# Patient Record
Sex: Male | Born: 1990 | Race: Black or African American | Hispanic: No | Marital: Single | Smoking: Never smoker
Health system: Southern US, Community
[De-identification: ages and names within clinical notes are randomized; demographics above are authoritative.]

## PROBLEM LIST (undated history)

## (undated) DIAGNOSIS — R519 Headache, unspecified: Secondary | ICD-10-CM

## (undated) DIAGNOSIS — M1990 Unspecified osteoarthritis, unspecified site: Secondary | ICD-10-CM

## (undated) DIAGNOSIS — F329 Major depressive disorder, single episode, unspecified: Secondary | ICD-10-CM

## (undated) DIAGNOSIS — F32A Depression, unspecified: Secondary | ICD-10-CM

## (undated) DIAGNOSIS — Z8619 Personal history of other infectious and parasitic diseases: Secondary | ICD-10-CM

## (undated) DIAGNOSIS — I10 Essential (primary) hypertension: Secondary | ICD-10-CM

## (undated) DIAGNOSIS — K5090 Crohn's disease, unspecified, without complications: Secondary | ICD-10-CM

## (undated) DIAGNOSIS — A630 Anogenital (venereal) warts: Secondary | ICD-10-CM

## (undated) DIAGNOSIS — R51 Headache: Secondary | ICD-10-CM

## (undated) HISTORY — DX: Headache: R51

## (undated) HISTORY — PX: TONSILLECTOMY: SUR1361

## (undated) HISTORY — DX: Essential (primary) hypertension: I10

## (undated) HISTORY — DX: Unspecified osteoarthritis, unspecified site: M19.90

## (undated) HISTORY — DX: Headache, unspecified: R51.9

## (undated) HISTORY — DX: Major depressive disorder, single episode, unspecified: F32.9

## (undated) HISTORY — PX: NASAL TURBINATE REDUCTION: SHX2072

## (undated) HISTORY — DX: Personal history of other infectious and parasitic diseases: Z86.19

## (undated) HISTORY — DX: Depression, unspecified: F32.A

---

## 2010-01-17 HISTORY — PX: MENISCUS REPAIR: SHX5179

## 2010-12-16 ENCOUNTER — Encounter: Payer: Self-pay | Admitting: Internal Medicine

## 2010-12-16 ENCOUNTER — Ambulatory Visit (INDEPENDENT_AMBULATORY_CARE_PROVIDER_SITE_OTHER): Payer: 59 | Admitting: Internal Medicine

## 2010-12-16 DIAGNOSIS — E6609 Other obesity due to excess calories: Secondary | ICD-10-CM | POA: Insufficient documentation

## 2010-12-16 DIAGNOSIS — F3289 Other specified depressive episodes: Secondary | ICD-10-CM

## 2010-12-16 DIAGNOSIS — F32A Depression, unspecified: Secondary | ICD-10-CM | POA: Insufficient documentation

## 2010-12-16 DIAGNOSIS — E669 Obesity, unspecified: Secondary | ICD-10-CM

## 2010-12-16 DIAGNOSIS — F329 Major depressive disorder, single episode, unspecified: Secondary | ICD-10-CM

## 2010-12-16 DIAGNOSIS — K589 Irritable bowel syndrome without diarrhea: Secondary | ICD-10-CM | POA: Insufficient documentation

## 2010-12-16 MED ORDER — SERTRALINE HCL 50 MG PO TABS: 50.0000 mg | ORAL_TABLET | Freq: Every day | ORAL | Status: DC

## 2010-12-16 MED ORDER — CLONAZEPAM 0.5 MG PO TABS: ORAL_TABLET | ORAL | Status: DC

## 2010-12-16 NOTE — Patient Instructions (Signed)
Return office visit one month    It is important that you exercise regularly, at least 20 minutes 3 to 4 times per week.  If you develop chest pain or shortness of breath seek  medical attention.  You need to lose weight.  Consider a lower calorie diet and regular exercise.

## 2010-12-16 NOTE — Progress Notes (Signed)
Subjective:    Patient ID: Zachary Sanford, male    DOB: 1990/05/26, 20 y.o.   MRN: [id number]  HPI  74 -year-old patient who is seen today to establish with our practice. He states he has had a history of depression and was treated from 2009 3 2011 until he self discontinued medication. Medication list clonazepam that he took at bedtime only he states that his mother has a history of bipolar depression. He states that for the past 8 months he has had increased depression irritability anxiety and poor sleep habits.  He has a long history of exogenous obesity. He complains that his appetite is always over active. He also has a long history of IBS diarrhea prone. He states that he has 2-3 loose bowel movements per day  Past medical history is otherwise fairly unremarkable. He had some labile blood pressure readings in high school but never has been treated he states he has a history of childhood asthma. He was hospitalized once at age 68 for infectious complications following a circumcision  Family history fairly noncontributory details of his biological father's health unknown. His mother is age 57 history bipolar disorder hypertension diabetes and arthritis A 46 year old brother has ADHD 5 step sisters one with cerebral palsy    Review of Systems  Constitutional: Negative for fever, chills, activity change, appetite change and fatigue.  HENT: Negative for hearing loss, ear pain, congestion, rhinorrhea, sneezing, mouth sores, trouble swallowing, neck pain, neck stiffness, dental problem, voice change, sinus pressure and tinnitus.   Eyes: Negative for photophobia, pain, redness and visual disturbance.  Respiratory: Negative for apnea, cough, choking, chest tightness, shortness of breath and wheezing.   Cardiovascular: Negative for chest pain, palpitations and leg swelling.  Gastrointestinal: Positive for diarrhea. Negative for nausea, vomiting, abdominal pain, constipation, blood in stool,  abdominal distention, anal bleeding and rectal pain.  Genitourinary: Negative for dysuria, urgency, frequency, hematuria, flank pain, decreased urine volume, discharge, penile swelling, scrotal swelling, difficulty urinating, genital sores and testicular pain.  Musculoskeletal: Negative for myalgias, back pain, joint swelling, arthralgias and gait problem.  Skin: Negative for color change, rash and wound.  Neurological: Negative for dizziness, tremors, seizures, syncope, facial asymmetry, speech difficulty, weakness, light-headedness, numbness and headaches.  Hematological: Negative for adenopathy. Does not bruise/bleed easily.  Psychiatric/Behavioral: Positive for sleep disturbance and dysphoric mood. Negative for suicidal ideas, hallucinations, behavioral problems, confusion, self-injury, decreased concentration and agitation. The patient is nervous/anxious.        Objective:   Physical Exam  Constitutional: He appears well-developed and well-nourished.       Weight 317  HENT:  Head: Normocephalic and atraumatic.  Right Ear: External ear normal.  Left Ear: External ear normal.  Nose: Nose normal.  Mouth/Throat: Oropharynx is clear and moist.  Eyes: Conjunctivae and EOM are normal. Pupils are equal, round, and reactive to light. No scleral icterus.  Neck: Normal range of motion. Neck supple. No JVD present. No thyromegaly present.  Cardiovascular: Regular rhythm, normal heart sounds and intact distal pulses.  Exam reveals no gallop and no friction rub.   No murmur heard. Pulmonary/Chest: Effort normal and breath sounds normal. He exhibits no tenderness.  Abdominal: Soft. Bowel sounds are normal. He exhibits no distension and no mass. There is no tenderness.  Genitourinary: Prostate normal and penis normal.  Musculoskeletal: Normal range of motion. He exhibits no edema and no tenderness.  Lymphadenopathy:    He has no cervical adenopathy.  Neurological: He is alert. He has normal  reflexes. No cranial nerve deficit. Coordination normal.  Skin: Skin is warm and dry. No rash noted.  Psychiatric: He has a normal mood and affect. His behavior is normal.          Assessment & Plan:   Depression with anxiety and sleep disturbance. We'll start the patient on sertraline 50 mg every morning we'll also place the patient on clonazepam at bedtime at least for the short-term. We'll recheck in 4 weeks  Exogenous obesity lifestyle issues discussed and encouraged. We'll reassess in one month

## 2011-01-03 ENCOUNTER — Ambulatory Visit (INDEPENDENT_AMBULATORY_CARE_PROVIDER_SITE_OTHER): Payer: 59

## 2011-01-03 DIAGNOSIS — R197 Diarrhea, unspecified: Secondary | ICD-10-CM

## 2011-01-03 DIAGNOSIS — R059 Cough, unspecified: Secondary | ICD-10-CM

## 2011-01-03 DIAGNOSIS — R509 Fever, unspecified: Secondary | ICD-10-CM

## 2011-01-03 DIAGNOSIS — R05 Cough: Secondary | ICD-10-CM

## 2011-01-10 ENCOUNTER — Ambulatory Visit: Payer: Self-pay | Admitting: Internal Medicine

## 2011-01-14 ENCOUNTER — Ambulatory Visit: Payer: 59 | Admitting: Internal Medicine

## 2011-01-14 DIAGNOSIS — Z0289 Encounter for other administrative examinations: Secondary | ICD-10-CM

## 2011-04-12 ENCOUNTER — Telehealth: Payer: Self-pay | Admitting: Internal Medicine

## 2011-04-12 ENCOUNTER — Ambulatory Visit (INDEPENDENT_AMBULATORY_CARE_PROVIDER_SITE_OTHER): Payer: 59 | Admitting: Family Medicine

## 2011-04-12 VITALS — BP 135/87 | HR 106 | Temp 98.4°F | Resp 16 | Ht 70.0 in | Wt 302.6 lb

## 2011-04-12 DIAGNOSIS — K5289 Other specified noninfective gastroenteritis and colitis: Secondary | ICD-10-CM

## 2011-04-12 DIAGNOSIS — E860 Dehydration: Secondary | ICD-10-CM

## 2011-04-12 DIAGNOSIS — K529 Noninfective gastroenteritis and colitis, unspecified: Secondary | ICD-10-CM

## 2011-04-12 LAB — POCT URINALYSIS DIPSTICK
Ketones, UA: NEGATIVE
Leukocytes, UA: NEGATIVE
Protein, UA: 30
Urobilinogen, UA: 0.2

## 2011-04-12 LAB — POCT CBC
Granulocyte percent: 53 %G (ref 37–80)
MCV: 80.4 fL (ref 80–97)
MID (cbc): 0.6 (ref 0–0.9)
POC Granulocyte: 3.8 (ref 2–6.9)
POC LYMPH PERCENT: 38.8 %L (ref 10–50)
POC MID %: 8.2 %M (ref 0–12)
Platelet Count, POC: 334 10*3/uL (ref 142–424)
RDW, POC: 15.8 %

## 2011-04-12 LAB — POCT UA - MICROSCOPIC ONLY
Crystals, Ur, HPF, POC: NEGATIVE
RBC, urine, microscopic: NEGATIVE

## 2011-04-12 NOTE — Progress Notes (Signed)
  Subjective:    Patient ID: Zachary Sanford, male    DOB: July 24, 1990, 21 y.o.   MRN: [id number]  HPI    Review of Systems     Objective:   Physical Exam   2X 4mg  ODT Zofran p.o. 7:24pm 2nd liter of normal saline hung at 8:05 pm.    Assessment & Plan:

## 2011-04-12 NOTE — Progress Notes (Signed)
Subjective:    Patient ID: Zachary Sanford, male    DOB: [birth year], 21 y.o.   MRN: [id number]  HPI Patient complains of nausea, vomiting and diarrhea since Saturday. Orthostasis Emesis and diarrhea non bloody   Exposed to Norovirus at work  Minimal po Poor energy   Review of Systems     Objective:   Physical Exam  Neck: Neck supple.  Cardiovascular: Normal rate, regular rhythm and normal heart sounds.   Pulmonary/Chest: Effort normal and breath sounds normal.  Abdominal: There is tenderness. There is guarding.    Neurological: He is alert.  Skin: Skin is warm.    Results for orders placed in visit on 04/12/11  POCT CBC      Component Value Range   WBC 7.2  4.6 - 10.2 (K/uL)   Lymph, poc 2.8  0.6 - 3.4    POC LYMPH PERCENT 38.8  10 - 50 (%L)   MID (cbc) 0.6  0 - 0.9    POC MID % 8.2  0 - 12 (%M)   POC Granulocyte 3.8  2 - 6.9    Granulocyte percent 53.0  37 - 80 (%G)   RBC 6.10  4.69 - 6.13 (M/uL)   Hemoglobin 15.9  14.1 - 18.1 (g/dL)   HCT, POC 40.9  81.1 - 53.7 (%)   MCV 80.4  80 - 97 (fL)   MCH, POC 26.1 (*) 27 - 31.2 (pg)   MCHC 32.4  31.8 - 35.4 (g/dL)   RDW, POC 91.4     Platelet Count, POC 334  142 - 424 (K/uL)   MPV 11.2  0 - 99.8 (fL)  POCT UA - MICROSCOPIC ONLY      Component Value Range   WBC, Ur, HPF, POC 1-2     RBC, urine, microscopic negative     Bacteria, U Microscopic negative     Mucus, UA positive     Epithelial cells, urine per micros 0-1     Crystals, Ur, HPF, POC negative     Casts, Ur, LPF, POC hyaline cast     Yeast, UA negative    POCT URINALYSIS DIPSTICK      Component Value Range   Color, UA yellow     Clarity, UA clear     Glucose, UA negative     Bilirubin, UA negatie     Ketones, UA negative     Spec Grav, UA >=1.030     Blood, UA negative     pH, UA 5.5     Protein, UA 30     Urobilinogen, UA 0.2     Nitrite, UA negative     Leukocytes, UA Negative    COMPREHENSIVE METABOLIC PANEL      Component Value Range   Sodium 142  135 - 145 (mEq/L)   Potassium 4.6  3.5 - 5.3 (mEq/L)   Chloride 111  96 - 112 (mEq/L)   CO2 20  19 - 32 (mEq/L)   Glucose, Bld 87  70 - 99 (mg/dL)   BUN 14  6 - 23 (mg/dL)   Creat 7.82  9.56 - 2.13 (mg/dL)   Total Bilirubin 0.4  0.3 - 1.2 (mg/dL)   Alkaline Phosphatase 75  39 - 117 (U/L)   AST 32  0 - 37 (U/L)   ALT 37  0 - 53 (U/L)   Total Protein 8.5 (*) 6.0 - 8.3 (g/dL)   Albumin 5.0  3.5 - 5.2 (g/dL)   Calcium 9.9  8.4 -  10.5 (mg/dL)        Assessment & Plan:   1. Gastroenteritis  POCT CBC, POCT UA - Microscopic Only, POCT urinalysis dipstick, Comprehensive metabolic panel   S/P 3 liters of IVF with significant relief of orthostasis 24 hour follow up; call overnight with any problems

## 2011-04-12 NOTE — Telephone Encounter (Signed)
Patient called stating that he has contracted the noro virus from his job and has had vomiting and diarrhea for 4 days and his stool is orange in color. Please advise.

## 2011-04-12 NOTE — Telephone Encounter (Signed)
Phenergan 25 mg #20 one every 6 hours as needed for nausea Generic Lomotil #20  2  initially then 1 every 4 hours as needed for diarrhea

## 2011-04-12 NOTE — Telephone Encounter (Signed)
Please advise 

## 2011-04-13 ENCOUNTER — Ambulatory Visit (INDEPENDENT_AMBULATORY_CARE_PROVIDER_SITE_OTHER): Payer: 59 | Admitting: Family Medicine

## 2011-04-13 VITALS — BP 124/90 | HR 97 | Temp 98.5°F | Resp 18 | Wt 309.0 lb

## 2011-04-13 DIAGNOSIS — K5289 Other specified noninfective gastroenteritis and colitis: Secondary | ICD-10-CM

## 2011-04-13 DIAGNOSIS — E860 Dehydration: Secondary | ICD-10-CM

## 2011-04-13 DIAGNOSIS — K529 Noninfective gastroenteritis and colitis, unspecified: Secondary | ICD-10-CM

## 2011-04-13 LAB — COMPREHENSIVE METABOLIC PANEL
ALT: 37 U/L (ref 0–53)
AST: 32 U/L (ref 0–37)
Albumin: 5 g/dL (ref 3.5–5.2)
Alkaline Phosphatase: 75 U/L (ref 39–117)
BUN: 14 mg/dL (ref 6–23)
CO2: 20 mEq/L (ref 19–32)
Calcium: 9.9 mg/dL (ref 8.4–10.5)
Chloride: 111 mEq/L (ref 96–112)
Creat: 1.11 mg/dL (ref 0.50–1.35)
Glucose, Bld: 87 mg/dL (ref 70–99)
Potassium: 4.6 mEq/L (ref 3.5–5.3)
Sodium: 142 mEq/L (ref 135–145)
Total Bilirubin: 0.4 mg/dL (ref 0.3–1.2)
Total Protein: 8.5 g/dL — ABNORMAL HIGH (ref 6.0–8.3)

## 2011-04-13 LAB — POCT UA - MICROSCOPIC ONLY
Casts, Ur, LPF, POC: NEGATIVE
Crystals, Ur, HPF, POC: NEGATIVE
Epithelial cells, urine per micros: NEGATIVE
Mucus, UA: NEGATIVE
RBC, urine, microscopic: NEGATIVE
Yeast, UA: NEGATIVE

## 2011-04-13 LAB — POCT URINALYSIS DIPSTICK
Bilirubin, UA: NEGATIVE
Blood, UA: NEGATIVE
Blood, UA: NEGATIVE
Glucose, UA: NEGATIVE
Ketones, UA: NEGATIVE
Ketones, UA: NEGATIVE
Leukocytes, UA: NEGATIVE
Nitrite, UA: NEGATIVE
Protein, UA: 30
Protein, UA: NEGATIVE
Spec Grav, UA: >=1.03
Spec Grav, UA: 1.025
Urobilinogen, UA: 0.2
pH, UA: 5.5
pH, UA: 5.5

## 2011-04-13 MED ORDER — ONDANSETRON 4 MG PO TBDP
8.0000 mg | ORAL_TABLET | Freq: Once | ORAL | Status: AC
  Administered 2011-04-13: 8 mg via ORAL

## 2011-04-13 NOTE — Telephone Encounter (Signed)
Spoke with pt - he went to urgent care last night - got 4 bags of IV fluids and they rx'd same meds - feeling alittle better this AM - instructed to keep [place] posted and let us know if we can do anything.  To Dr. Amador Cunas to inform

## 2011-04-13 NOTE — Progress Notes (Signed)
  Subjective:    Patient ID: Jobe Marker, male    DOB: 1990/04/10, 21 y.o.   MRN: [id number]  HPI  Presents in 24 hour follow up of gastroenteritis. Continues to experience voluminous watery diarrhea that is orange in color since Saturday. No further emesis since 4:30PM yesterday.  Tolerating Gatorade; tried drinking smoothie this morning however did have some nausea  Abdominal pain and cramping improving  More energy  No further subjective orthostasis  PMH/ history of elevated BP Review of Systems     Objective:   Physical Exam  Neck: Neck supple.  Cardiovascular: Normal rate, regular rhythm and normal heart sounds.   Pulmonary/Chest: Effort normal and breath sounds normal.  Abdominal: Soft. Bowel sounds are increased. There is tenderness (diffuse). There is no rebound.  Neurological: He is alert.  Skin: Skin is warm.  Psychiatric: He has a normal mood and affect.      Results for orders placed in visit on 04/13/11  POCT URINALYSIS DIPSTICK      Component Value Range   Color, UA yellow     Clarity, UA clear     Glucose, UA neg     Bilirubin, UA neg     Ketones, UA neg     Spec Grav, UA >=1.030     Blood, UA neg     pH, UA 5.5     Protein, UA 30     Urobilinogen, UA 0.2     Nitrite, UA neg     Leukocytes, UA Negative    POCT UA - MICROSCOPIC ONLY      Component Value Range   WBC, Ur, HPF, POC 0-3     RBC, urine, microscopic neg     Bacteria, U Microscopic trace     Mucus, UA neg     Epithelial cells, urine per micros neg     Crystals, Ur, HPF, POC neg     Casts, Ur, LPF, POC neg     Yeast, UA neg         Assessment & Plan:   1. Dehydration  Urinalysis Dipstick, POCT UA - Microscopic Only, ondansetron (ZOFRAN-ODT) disintegrating tablet 8 mg, POCT urinalysis dipstick  2. Gastroenteritis     BRAT diet Anticipatory guidance

## 2011-04-15 ENCOUNTER — Telehealth: Payer: Self-pay

## 2011-04-15 NOTE — Telephone Encounter (Signed)
Pt would like to know if labs are in, pt also inquiring about his return to work form.

## 2012-01-01 ENCOUNTER — Emergency Department (HOSPITAL_BASED_OUTPATIENT_CLINIC_OR_DEPARTMENT_OTHER): Payer: 59

## 2012-01-01 ENCOUNTER — Other Ambulatory Visit: Payer: Self-pay

## 2012-01-01 ENCOUNTER — Emergency Department (HOSPITAL_BASED_OUTPATIENT_CLINIC_OR_DEPARTMENT_OTHER)
Admission: EM | Admit: 2012-01-01 | Discharge: 2012-01-02 | Disposition: A | Discharge status: Home / Self Care | Payer: 59 | Attending: Emergency Medicine | Admitting: Emergency Medicine

## 2012-01-01 ENCOUNTER — Encounter (HOSPITAL_BASED_OUTPATIENT_CLINIC_OR_DEPARTMENT_OTHER): Payer: Self-pay | Admitting: *Deleted

## 2012-01-01 DIAGNOSIS — R071 Chest pain on breathing: Secondary | ICD-10-CM | POA: Insufficient documentation

## 2012-01-01 DIAGNOSIS — R110 Nausea: Secondary | ICD-10-CM | POA: Insufficient documentation

## 2012-01-01 DIAGNOSIS — R079 Chest pain, unspecified: Secondary | ICD-10-CM

## 2012-01-01 DIAGNOSIS — Z79899 Other long term (current) drug therapy: Secondary | ICD-10-CM | POA: Insufficient documentation

## 2012-01-01 DIAGNOSIS — F3289 Other specified depressive episodes: Secondary | ICD-10-CM | POA: Insufficient documentation

## 2012-01-01 DIAGNOSIS — M129 Arthropathy, unspecified: Secondary | ICD-10-CM | POA: Insufficient documentation

## 2012-01-01 DIAGNOSIS — I10 Essential (primary) hypertension: Secondary | ICD-10-CM | POA: Insufficient documentation

## 2012-01-01 DIAGNOSIS — F329 Major depressive disorder, single episode, unspecified: Secondary | ICD-10-CM | POA: Insufficient documentation

## 2012-01-01 DIAGNOSIS — J45909 Unspecified asthma, uncomplicated: Secondary | ICD-10-CM | POA: Insufficient documentation

## 2012-01-01 DIAGNOSIS — Z8619 Personal history of other infectious and parasitic diseases: Secondary | ICD-10-CM | POA: Insufficient documentation

## 2012-01-01 DIAGNOSIS — R0789 Other chest pain: Secondary | ICD-10-CM

## 2012-01-01 LAB — D-DIMER, QUANTITATIVE: D-Dimer, Quant: <0.27 ug/mL-FEU (ref 0.00–0.48)

## 2012-01-01 MED ORDER — HYDROCODONE-ACETAMINOPHEN 5-325 MG PO TABS: 1.0000 | ORAL_TABLET | Freq: Four times a day (QID) | ORAL | Status: DC | PRN

## 2012-01-01 MED ORDER — PROMETHAZINE HCL 25 MG PO TABS: 25.0000 mg | ORAL_TABLET | Freq: Four times a day (QID) | ORAL | Status: DC | PRN

## 2012-01-01 MED ORDER — SODIUM CHLORIDE 0.9 % IV BOLUS (SEPSIS)
250.0000 mL | Freq: Once | INTRAVENOUS | Status: AC
  Administered 2012-01-01: 250 mL via INTRAVENOUS

## 2012-01-01 MED ORDER — HYDROMORPHONE HCL PF 1 MG/ML IJ SOLN
1.0000 mg | Freq: Once | INTRAMUSCULAR | Status: AC
  Administered 2012-01-01: 1 mg via INTRAVENOUS
  Filled 2012-01-01: qty 1

## 2012-01-01 MED ORDER — NAPROXEN 500 MG PO TABS: 500.0000 mg | ORAL_TABLET | Freq: Two times a day (BID) | ORAL | Status: DC

## 2012-01-01 MED ORDER — ONDANSETRON HCL 4 MG/2ML IJ SOLN
4.0000 mg | Freq: Once | INTRAMUSCULAR | Status: AC
  Administered 2012-01-01: 4 mg via INTRAVENOUS
  Filled 2012-01-01: qty 2

## 2012-01-01 MED ORDER — SODIUM CHLORIDE 0.9 % IV SOLN: INTRAVENOUS | Status: DC

## 2012-01-01 MED ORDER — ONDANSETRON HCL 4 MG/2ML IJ SOLN
4.0000 mg | Freq: Once | INTRAMUSCULAR | Status: AC
  Administered 2012-01-02: 4 mg via INTRAVENOUS
  Filled 2012-01-01: qty 2

## 2012-01-01 NOTE — ED Notes (Signed)
Pt states his chest is hurting again. 4/10. Dr Deretha Emory advised.

## 2012-01-01 NOTE — ED Notes (Signed)
Given water to drink. Friend given something to drink as well.

## 2012-01-01 NOTE — ED Notes (Signed)
Pt states he was sitting down last night, just before bedtime and had a sudden onset of midsternal CP. No radiation. +nausea, but denies other s/s. Increased stress.

## 2012-01-01 NOTE — ED Provider Notes (Signed)
History  This chart was scribed for Zachary Jakes, MD by Shari Heritage, ED Scribe. The patient was seen in room MH04/MH04. Patient's care was started at 2036.  CSN: [id number]  Arrival date & time 01/01/12  1842   First MD Initiated Contact with Patient 01/01/12 2036      Chief Complaint  Patient presents with  . Chest Pain    Patient is a 21 y.o. male presenting with chest pain. The history is provided by the patient. No language interpreter was used.  Chest Pain The chest pain began 12 - 24 hours ago. Chest pain occurs constantly. The chest pain is unchanged. At its most intense, the pain is at 6/10. The pain is currently at 6/10. Quality: throbbing. The pain radiates to the upper back. Chest pain is worsened by deep breathing and certain positions. Primary symptoms include nausea. Pertinent negatives for primary symptoms include no fever, no shortness of breath, no cough, no abdominal pain and no vomiting. He tried nothing for the symptoms.  His past medical history is significant for hypertension.     HPI Comments: Zachary Sanford is a 21 y.o. male who presents to the Emergency Department complaining of moderate, constant, throbbing substernal chest pain that radiates around to the upper back onset 22.5 hours ago. Patient rates pain as 6/10. Pain is worse with movement and deep breaths. There is associated back pain and nausea.  Patient denies any obvious injury or trauma to the area. He has never had this type of pain before. He has not taken any medications for pain relief. Patient denies vomiting, shortness of breath, headache, fever, congestion, sore throat, rhinorrhea, cough, neck pain, dysuria, hematuria, leg swelling or rash. Patient does not have a history of bleeding easily. Other medical history includes asthma, arthritis, depression and hypertension. Patient does not smoke.    Past Medical History  Diagnosis Date  . Asthma   . Arthritis   . Depression   .  Hypertension   . History of chicken pox     History reviewed. No pertinent past surgical history.  Family History  Problem Relation Age of Onset  . Diabetes Mother   . Alcohol abuse Neg Hx     family hx  . Arthritis Neg Hx     family hx  . Hypertension Neg Hx     family hx  . Mental illness Neg Hx     family hx    History  Substance Use Topics  . Smoking status: Never Smoker   . Smokeless tobacco: Never Used  . Alcohol Use: Yes      Review of Systems  Constitutional: Negative for fever.  HENT: Negative for congestion, sore throat, rhinorrhea and neck pain.   Eyes: Negative.   Respiratory: Negative for cough and shortness of breath.   Cardiovascular: Positive for chest pain.  Gastrointestinal: Positive for nausea. Negative for vomiting and abdominal pain.  Genitourinary: Negative for dysuria.  Musculoskeletal: Positive for back pain.  Skin: Negative for rash.  Neurological: Negative for headaches.    Allergies  Review of patient's allergies indicates no known allergies.  Home Medications   Current Outpatient Rx  Name  Route  Sig  Dispense  Refill  . CLONAZEPAM 0.5 MG PO TABS      1 at bedtime   60 tablet   0   . HYDROCODONE-ACETAMINOPHEN 5-325 MG PO TABS   Oral   Take 1-2 tablets by mouth every 6 (six) hours as needed for pain.  10 tablet   0   . NAPROXEN 500 MG PO TABS   Oral   Take 1 tablet (500 mg total) by mouth 2 (two) times daily.   14 tablet   0   . PROMETHAZINE HCL 25 MG PO TABS   Oral   Take 1 tablet (25 mg total) by mouth every 6 (six) hours as needed for nausea.   30 tablet   0   . SERTRALINE HCL 50 MG PO TABS   Oral   Take 1 tablet (50 mg total) by mouth daily.   30 tablet   2     Triage Vitals: BP 136/91  Pulse 78  Temp 98.5 F (36.9 C) (Oral)  Resp 20  Ht 5\' 10"  (1.778 m)  Wt 275 lb (124.739 kg)  BMI 39.46 kg/m2  SpO2 100%  Physical Exam  Constitutional: He is oriented to person, place, and time. He appears  well-developed and well-nourished.  HENT:  Head: Normocephalic and atraumatic.  Mouth/Throat: Oropharynx is clear and moist and mucous membranes are normal. No oropharyngeal exudate.  Eyes: Conjunctivae normal and EOM are normal. Pupils are equal, round, and reactive to light. No scleral icterus.  Neck: Neck supple.  Cardiovascular: Normal rate and regular rhythm.   No murmur heard. Pulmonary/Chest: Effort normal and breath sounds normal. No respiratory distress. He has no wheezes. He has no rales.  Abdominal: Soft. Bowel sounds are normal. He exhibits no distension. There is no tenderness. There is no rebound and no guarding.  Musculoskeletal: He exhibits no edema and no tenderness.  Neurological: He is alert and oriented to person, place, and time.  Skin: Skin is warm and dry. No rash noted.    ED Course  Procedures (including critical care time) DIAGNOSTIC STUDIES: Oxygen Saturation is 100% on room air, normal by my interpretation.    COORDINATION OF CARE: 9:02 PM- Patient informed of current plan for treatment and evaluation and agrees with plan at this time. Oxygen level is 100% on room air currently.   Results for orders placed during the hospital encounter of 01/01/12  TROPONIN I      Component Value Range   Troponin I <0.30  <0.30 ng/mL  D-DIMER, QUANTITATIVE      Component Value Range   D-Dimer, Quant <0.27  0.00 - 0.48 ug/mL-FEU   Dg Chest 2 View  01/01/2012  *RADIOLOGY REPORT*  Clinical Data: Chest pain  CHEST - 2 VIEW  Comparison: None.  Findings: Hypoaeration.  Interstitial and vascular crowding. Right hemidiaphragm elevation.  Cardiomediastinal contours within normal range.  No pleural effusion or pneumothorax.  No acute osseous finding.  IMPRESSION: Hypoaeration with interstitial and vascular crowding.  No definite acute process.   Original Report Authenticated By: Jearld Lesch, M.D.         Date: 01/01/2012  Rate: 92  Rhythm: normal sinus rhythm  QRS  Axis: normal  Intervals: normal  ST/T Wave abnormalities: normal  Conduction Disutrbances:none  Narrative Interpretation:   Old EKG Reviewed: none available    1. Chest pain   2. Chest wall pain       MDM   Followup with your regular Dr. Billie Ruddy for the chest pain seemed clinically to be chest wall in nature workup was negative chest x-ray shows no signs of pneumonia or pneumothorax EKG without any acute findings troponin was negative d-dimer was negative not concerning for pulmonary embolism at this time. Will treat patient with anti-inflammatory and pain medicine followup with her regular  Dr.    I personally performed the services described in this documentation, which was scribed in my presence. The recorded information has been reviewed and is accurate.    Zachary Jakes, MD 01/01/12 [phone]

## 2012-01-02 ENCOUNTER — Telehealth: Payer: Self-pay | Admitting: Internal Medicine

## 2012-01-02 NOTE — Telephone Encounter (Signed)
Called and spoke to pt's mother he is at work. Left message for him to call me tomorrow morning.

## 2012-01-02 NOTE — Telephone Encounter (Signed)
Attempted to call x1 emergent call no answer, noted patient had refused disposition call 911.

## 2012-01-04 NOTE — Telephone Encounter (Signed)
Spoke to pt asked him if he was feeling better since ED visit 12/15? Pt stated a little. Asked him if needs f/u appt.? He stated yes. Told him okay will transfer to schedule follow up appt. Pt transferred.

## 2012-01-10 ENCOUNTER — Ambulatory Visit: Payer: 59 | Admitting: Internal Medicine

## 2012-01-10 DIAGNOSIS — Z0289 Encounter for other administrative examinations: Secondary | ICD-10-CM

## 2012-04-23 ENCOUNTER — Emergency Department (HOSPITAL_BASED_OUTPATIENT_CLINIC_OR_DEPARTMENT_OTHER)
Admission: EM | Admit: 2012-04-23 | Discharge: 2012-04-23 | Disposition: A | Discharge status: Home / Self Care | Payer: Managed Care, Other (non HMO) | Attending: Emergency Medicine | Admitting: Emergency Medicine

## 2012-04-23 ENCOUNTER — Emergency Department (HOSPITAL_BASED_OUTPATIENT_CLINIC_OR_DEPARTMENT_OTHER): Payer: Managed Care, Other (non HMO)

## 2012-04-23 ENCOUNTER — Encounter (HOSPITAL_BASED_OUTPATIENT_CLINIC_OR_DEPARTMENT_OTHER): Payer: Self-pay | Admitting: *Deleted

## 2012-04-23 DIAGNOSIS — R5381 Other malaise: Secondary | ICD-10-CM | POA: Insufficient documentation

## 2012-04-23 DIAGNOSIS — R059 Cough, unspecified: Secondary | ICD-10-CM | POA: Insufficient documentation

## 2012-04-23 DIAGNOSIS — I10 Essential (primary) hypertension: Secondary | ICD-10-CM | POA: Insufficient documentation

## 2012-04-23 DIAGNOSIS — J3489 Other specified disorders of nose and nasal sinuses: Secondary | ICD-10-CM | POA: Insufficient documentation

## 2012-04-23 DIAGNOSIS — J45901 Unspecified asthma with (acute) exacerbation: Secondary | ICD-10-CM | POA: Insufficient documentation

## 2012-04-23 DIAGNOSIS — R05 Cough: Secondary | ICD-10-CM | POA: Insufficient documentation

## 2012-04-23 DIAGNOSIS — Z8659 Personal history of other mental and behavioral disorders: Secondary | ICD-10-CM | POA: Insufficient documentation

## 2012-04-23 DIAGNOSIS — M129 Arthropathy, unspecified: Secondary | ICD-10-CM | POA: Insufficient documentation

## 2012-04-23 DIAGNOSIS — J069 Acute upper respiratory infection, unspecified: Secondary | ICD-10-CM | POA: Insufficient documentation

## 2012-04-23 DIAGNOSIS — R509 Fever, unspecified: Secondary | ICD-10-CM | POA: Insufficient documentation

## 2012-04-23 DIAGNOSIS — Z8619 Personal history of other infectious and parasitic diseases: Secondary | ICD-10-CM | POA: Insufficient documentation

## 2012-04-23 DIAGNOSIS — R000 Tachycardia, unspecified: Secondary | ICD-10-CM | POA: Insufficient documentation

## 2012-04-23 DIAGNOSIS — J029 Acute pharyngitis, unspecified: Secondary | ICD-10-CM | POA: Insufficient documentation

## 2012-04-23 LAB — RAPID STREP SCREEN (MED CTR MEBANE ONLY): Streptococcus, Group A Screen (Direct): NEGATIVE

## 2012-04-23 MED ORDER — ALBUTEROL SULFATE (5 MG/ML) 0.5% IN NEBU
INHALATION_SOLUTION | RESPIRATORY_TRACT | Status: AC
  Administered 2012-04-23: 5 mg
  Filled 2012-04-23: qty 1

## 2012-04-23 MED ORDER — IPRATROPIUM BROMIDE 0.02 % IN SOLN
RESPIRATORY_TRACT | Status: AC
  Administered 2012-04-23: 0.5 mg
  Filled 2012-04-23: qty 2.5

## 2012-04-23 MED ORDER — OXYCODONE-ACETAMINOPHEN 5-325 MG PO TABS: 1.0000 | ORAL_TABLET | Freq: Four times a day (QID) | ORAL | Status: DC | PRN

## 2012-04-23 NOTE — ED Notes (Signed)
Patient reports SOB since Friday, congestion/some vomiting/nausea/fever over the weekend Hx of asthma. Took sudafed

## 2012-04-23 NOTE — ED Provider Notes (Signed)
History     CSN: [id number]  Arrival date & time 04/23/12  0710   First MD Initiated Contact with Patient 04/23/12 [phone]      Chief Complaint  Patient presents with  . Shortness of Breath    (Consider location/radiation/quality/duration/timing/severity/associated sxs/prior treatment) Patient is a 22 y.o. male presenting with shortness of breath. The history is provided by the patient.  Shortness of Breath Severity:  Moderate Associated symptoms: fever and sore throat   Associated symptoms: no abdominal pain, no chest pain, no headaches, no rash and no vomiting    patient's had shortness of breath and cough for the last 3 days. He's also had sore throat nasal congestion. He has had chills and fever. Patient states he has a history of asthma but his last exacerbation was when he was 22 years old. He states that this feels like that. He states feels fatigued. No nausea vomiting diarrhea. No abdominal pain. No chest pain. 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   Past Medical History  Diagnosis Date  . Asthma   . Arthritis   . Depression   . Hypertension   . History of chicken pox     History reviewed. No pertinent past surgical history.  Family History  Problem Relation Age of Onset  . Diabetes Mother   . Alcohol abuse Neg Hx     family hx  . Arthritis Neg Hx     family hx  . Hypertension Neg Hx     family hx  . Mental illness Neg Hx     family hx    History  Substance Use Topics  . Smoking status: Never Smoker   . Smokeless tobacco: Never Used  . Alcohol Use: Yes      Review of Systems  Constitutional: Positive for fever, chills and fatigue. Negative for activity change and appetite change.  HENT: Positive for congestion and sore throat. Negative for neck stiffness.   Eyes: Negative for pain.  Respiratory: Positive for shortness of breath. Negative for chest tightness.   Cardiovascular: Negative for chest pain and leg swelling.   Gastrointestinal: Negative for nausea, vomiting, abdominal pain and diarrhea.  Genitourinary: Negative for flank pain.  Musculoskeletal: Negative for back pain.  Skin: Negative for rash.  Neurological: Negative for weakness, numbness and headaches.  Psychiatric/Behavioral: Negative for behavioral problems.    Allergies  Review of patient's allergies indicates no known allergies.  Home Medications   Current Outpatient Rx  Name  Route  Sig  Dispense  Refill  . EXPIRED: clonazePAM (KLONOPIN) 0.5 MG tablet      1 at bedtime   60 tablet   0   . HYDROcodone-acetaminophen (NORCO/VICODIN) 5-325 MG per tablet   Oral   Take 1-2 tablets by mouth every 6 (six) hours as needed for pain.   10 tablet   0   . naproxen (NAPROSYN) 500 MG tablet   Oral   Take 1 tablet (500 mg total) by mouth 2 (two) times daily.   14 tablet   0   . promethazine (PHENERGAN) 25 MG tablet   Oral   Take 1 tablet (25 mg total) by mouth every 6 (six) hours as needed for nausea.   30 tablet   0   . EXPIRED: sertraline (ZOLOFT) 50 MG tablet   Oral   Take 1 tablet (50 mg total) by mouth daily.   30 tablet   2     BP 147/115  Pulse  129  Temp(Src) 100 F (37.8 C) (Oral)  Resp 22  SpO2 99%  Physical Exam  Nursing note and vitals reviewed. Constitutional: He is oriented to person, place, and time. He appears well-developed and well-nourished.  HENT:  Head: Normocephalic and atraumatic.  Posterior pharyngeal erythema with mild exudate.  Eyes: EOM are normal. Pupils are equal, round, and reactive to light.  Neck: Normal range of motion. Neck supple.  Cardiovascular: Regular rhythm and normal heart sounds.   No murmur heard. Tachycardia.  Pulmonary/Chest: Effort normal and breath sounds normal. No respiratory distress.  Abdominal: Soft. Bowel sounds are normal. He exhibits no distension and no mass. There is no tenderness. There is no rebound and no guarding.  Musculoskeletal: Normal range of  motion. He exhibits no edema.  Neurological: He is alert and oriented to person, place, and time. No cranial nerve deficit.  Skin: Skin is warm and dry.  Psychiatric: He has a normal mood and affect.    ED Course  Procedures (including critical care time)  Labs Reviewed  RAPID STREP SCREEN   Dg Chest 2 View  04/23/2012  *RADIOLOGY REPORT*  Clinical Data: Chest pain and shortness of breath for 3 days.  CHEST - 2 VIEW  Comparison: 01/01/2012.  Findings: There is overall improved aeration of the lungs, although the degree of inspiration remains suboptimal on the lateral view. There is no airspace disease, edema or pleural effusion.  Heart size and mediastinal contours are stable.  IMPRESSION: No active cardiopulmonary process.   Original Report Authenticated By: Carey Bullocks, M.D.      1. URI (upper respiratory infection)       MDM  Patient with URI symptoms and cough. Negative strep test a negative x-ray. Patient does not appear in respiratory distress. No relief with breathing treatment. Patient be discharged home.        Juliet Rude. Rubin Payor, MD 04/23/12 [phone]

## 2012-06-29 ENCOUNTER — Ambulatory Visit: Payer: 59 | Admitting: Family Medicine

## 2012-06-29 DIAGNOSIS — Z0289 Encounter for other administrative examinations: Secondary | ICD-10-CM

## 2013-07-12 ENCOUNTER — Ambulatory Visit (INDEPENDENT_AMBULATORY_CARE_PROVIDER_SITE_OTHER): Payer: BC Managed Care – PPO | Admitting: Family Medicine

## 2013-07-12 VITALS — BP 128/74 | HR 60 | Temp 98.3°F | Resp 20 | Ht 71.0 in | Wt 297.0 lb

## 2013-07-12 DIAGNOSIS — G43011 Migraine without aura, intractable, with status migrainosus: Secondary | ICD-10-CM

## 2013-07-12 DIAGNOSIS — R110 Nausea: Secondary | ICD-10-CM

## 2013-07-12 MED ORDER — TOPIRAMATE 50 MG PO TABS: 50.0000 mg | ORAL_TABLET | Freq: Two times a day (BID) | ORAL | Status: DC

## 2013-07-12 MED ORDER — HYDROCODONE-ACETAMINOPHEN 5-325 MG PO TABS: 1.0000 | ORAL_TABLET | Freq: Four times a day (QID) | ORAL | Status: DC | PRN

## 2013-07-12 NOTE — Progress Notes (Signed)
Subjective:    Patient ID: Zachary Sanford, male    DOB: 01/11/1991, 23 y.o.   MRN: [id number]  Migraine  Associated symptoms include nausea and vomiting. Pertinent negatives include no fever.   Chief Complaint  Patient presents with   Migraine    x 5 days   Nausea    x 5 days  . This chart was scribed for Elvina SidleKurt Lauenstein, MD  by Andrew Auaven Small, ED Scribe. This patient was seen in room 3 and the patient's care was started at 12:02 PM.  HPI Comments: Zachary Sanford is a 23 y.o. male who presents to the [facility] complaining of frontal migraine x 5 days with associated nausea, emesis, and left visual disturbance. Pt reports he wakes up with migraine. Pt has tried  imitrix  without relief to migraine. At this time he rates migraine 4/10. Reports he began taking phentermine  2 weeks ago and believes this is the cause of his migraines. Pt denies fever.   Pt is a Pharmacologistpharmacy technician.    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 Authorizing Provider  escitalopram (LEXAPRO) 10 MG tablet Take 15 mg by mouth daily.   Yes Historical Provider, MD  HYDROcodone-acetaminophen (NORCO/VICODIN) 5-325 MG per tablet Take 1-2 tablets by mouth every 6 (six) hours as needed for pain. 01/01/12  Yes Vanetta MuldersScott Zackowski, MD  naproxen (NAPROSYN) 500 MG tablet Take 1 tablet (500 mg total) by mouth 2 (two) times daily. 01/01/12  Yes Vanetta MuldersScott Zackowski, MD  omeprazole (PRILOSEC) 40 MG capsule Take 40 mg by mouth daily.   Yes Historical Provider, MD  oxyCODONE-acetaminophen (PERCOCET/ROXICET) 5-325 MG per tablet Take 1-2 tablets by mouth every 6 (six) hours as  needed for pain. 04/23/12  Yes Nathan R. Pickering, MD  phentermine 15 MG capsule Take 15 mg by mouth every morning.   Yes Historical Provider, MD  promethazine (PHENERGAN) 25 MG tablet Take 1 tablet (25 mg total) by mouth every 6 (six) hours as needed for nausea. 01/01/12  Yes Vanetta MuldersScott Zackowski, MD  SUMAtriptan (IMITREX) 25 MG tablet Take 25 mg by mouth every 2 (two) hours as needed for migraine or headache. May repeat in 2 hours if headache persists or recurs.   Yes Historical Provider, MD  clonazePAM (KLONOPIN) 0.5 MG tablet 1 at bedtime 12/16/10 11/14/11  Gordy SaversPeter F Kwiatkowski, MD  sertraline (ZOLOFT) 50 MG tablet Take 1 tablet (50 mg total) by mouth daily. 12/16/10 12/16/11  Gordy SaversPeter F Kwiatkowski, MD   Review of Systems  Constitutional: Negative for fever and chills.  Eyes: Positive for visual disturbance.  Gastrointestinal: Positive for nausea and vomiting.  Neurological: Positive for headaches.      Objective:   Physical Exam  Nursing note and vitals reviewed. Constitutional: He is oriented to person, place, and time. He appears well-developed and well-nourished. No distress.  HENT:  Head: Normocephalic and atraumatic.  Right Ear: External ear normal.  Left Ear: External ear normal.  Eyes: Conjunctivae and EOM are normal. Pupils are equal, round, and reactive to light.  Neck: Normal range of motion. Neck supple. No tracheal deviation  present.  Cardiovascular: Normal rate, regular rhythm and normal heart sounds.  Exam reveals no gallop and no friction rub.   No murmur heard. Pulmonary/Chest: Effort normal and breath sounds normal. No respiratory distress. He has no wheezes. He has no rales. He exhibits no tenderness.  Musculoskeletal: Normal range of motion. He exhibits no edema.  Neurological: He is alert and oriented to person, place, and time.  Skin: Skin is warm and dry.  Psychiatric: He has a normal mood and affect. His behavior is normal.   No CN abn, normal gait, normal grasps  and arm strength     Assessment & Plan:   1. Intractable migraine without aura and with status migrainosus    Meds ordered this encounter      HYDROcodone-acetaminophen (NORCO/VICODIN) 5-325 MG per tablet    Sig: Take 1-2 tablets by mouth every 6 (six) hours as needed.    Dispense:  15 tablet    Refill:  0   topiramate (TOPAMAX) 50 MG tablet    Sig: Take 1 tablet (50 mg total) by mouth 2 (two) times daily.    Dispense:  10 tablet    Refill:  2       Elvina SidleKurt Lauenstein, MD

## 2013-07-12 NOTE — Patient Instructions (Signed)

## 2013-10-04 ENCOUNTER — Encounter (HOSPITAL_BASED_OUTPATIENT_CLINIC_OR_DEPARTMENT_OTHER): Payer: Self-pay | Admitting: Emergency Medicine

## 2013-10-04 ENCOUNTER — Emergency Department (HOSPITAL_BASED_OUTPATIENT_CLINIC_OR_DEPARTMENT_OTHER)
Admission: EM | Admit: 2013-10-04 | Discharge: 2013-10-04 | Disposition: A | Discharge status: Home / Self Care | Payer: BC Managed Care – PPO | Attending: Emergency Medicine | Admitting: Emergency Medicine

## 2013-10-04 DIAGNOSIS — J029 Acute pharyngitis, unspecified: Secondary | ICD-10-CM | POA: Insufficient documentation

## 2013-10-04 DIAGNOSIS — J020 Streptococcal pharyngitis: Secondary | ICD-10-CM | POA: Insufficient documentation

## 2013-10-04 DIAGNOSIS — M129 Arthropathy, unspecified: Secondary | ICD-10-CM | POA: Insufficient documentation

## 2013-10-04 DIAGNOSIS — Z791 Long term (current) use of non-steroidal anti-inflammatories (NSAID): Secondary | ICD-10-CM | POA: Diagnosis not present

## 2013-10-04 DIAGNOSIS — I10 Essential (primary) hypertension: Secondary | ICD-10-CM | POA: Diagnosis not present

## 2013-10-04 DIAGNOSIS — Z79899 Other long term (current) drug therapy: Secondary | ICD-10-CM | POA: Diagnosis not present

## 2013-10-04 DIAGNOSIS — J45909 Unspecified asthma, uncomplicated: Secondary | ICD-10-CM | POA: Diagnosis not present

## 2013-10-04 DIAGNOSIS — F329 Major depressive disorder, single episode, unspecified: Secondary | ICD-10-CM | POA: Diagnosis not present

## 2013-10-04 DIAGNOSIS — F3289 Other specified depressive episodes: Secondary | ICD-10-CM | POA: Insufficient documentation

## 2013-10-04 DIAGNOSIS — H9209 Otalgia, unspecified ear: Secondary | ICD-10-CM | POA: Insufficient documentation

## 2013-10-04 LAB — BASIC METABOLIC PANEL
Anion gap: 13 (ref 5–15)
BUN: 12 mg/dL (ref 6–23)
CALCIUM: 9.9 mg/dL (ref 8.4–10.5)
CO2: 26 mEq/L (ref 19–32)
Chloride: 103 mEq/L (ref 96–112)
Creatinine, Ser: 1 mg/dL (ref 0.50–1.35)
GFR calc Af Amer: >90 mL/min (ref >90)
GLUCOSE: 99 mg/dL (ref 70–99)
Potassium: 4.4 mEq/L (ref 3.7–5.3)
Sodium: 142 mEq/L (ref 137–147)

## 2013-10-04 LAB — CBC WITH DIFFERENTIAL/PLATELET
Basophils Absolute: 0 10*3/uL (ref 0.0–0.1)
Basophils Relative: 0 % (ref 0–1)
EOS ABS: 0.1 10*3/uL (ref 0.0–0.7)
EOS PCT: 1 % (ref 0–5)
HCT: 45 % (ref 39.0–52.0)
Hemoglobin: 14.9 g/dL (ref 13.0–17.0)
LYMPHS ABS: 1.7 10*3/uL (ref 0.7–4.0)
Lymphocytes Relative: 13 % (ref 12–46)
MCH: 26.6 pg (ref 26.0–34.0)
MCHC: 33.1 g/dL (ref 30.0–36.0)
MCV: 80.4 fL (ref 78.0–100.0)
MONO ABS: 1.4 10*3/uL — AB (ref 0.1–1.0)
Monocytes Relative: 10 % (ref 3–12)
Neutro Abs: 10.5 10*3/uL — ABNORMAL HIGH (ref 1.7–7.7)
Neutrophils Relative %: 76 % (ref 43–77)
PLATELETS: 209 10*3/uL (ref 150–400)
RBC: 5.6 MIL/uL (ref 4.22–5.81)
RDW: 14.4 % (ref 11.5–15.5)
WBC: 13.8 10*3/uL — ABNORMAL HIGH (ref 4.0–10.5)

## 2013-10-04 MED ORDER — OXYCODONE-ACETAMINOPHEN 5-325 MG PO TABS: 1.0000 | ORAL_TABLET | Freq: Four times a day (QID) | ORAL | Status: DC | PRN

## 2013-10-04 MED ORDER — CLINDAMYCIN HCL 150 MG PO CAPS: 450.0000 mg | ORAL_CAPSULE | Freq: Three times a day (TID) | ORAL | Status: DC

## 2013-10-04 NOTE — ED Notes (Signed)
MD at bedside. 

## 2013-10-04 NOTE — Discharge Instructions (Signed)
Take by clindamycin as directed. Take the oxycodone as needed for throat pain. Referral to ear nose and throat provided as per your wishes. Return for any newer worse symptoms. Would expect to be improving in 2 days. Work note provided.

## 2013-10-04 NOTE — ED Notes (Signed)
Reports dx with strep throat and bilateral ear infections on Tuesday. Reports "it's getting worse."

## 2013-10-04 NOTE — ED Provider Notes (Signed)
CSN: [id number]     Arrival date & time 10/04/13  4098 History   First MD Initiated Contact with Patient 10/04/13 1109     Chief Complaint  Patient presents with  . Sore Throat  . Otalgia     (Consider location/radiation/quality/duration/timing/severity/associated sxs/prior Treatment) Patient is a 23 y.o. male presenting with pharyngitis and ear pain. The history is provided by the patient.  Sore Throat Pertinent negatives include no chest pain, no abdominal pain, no headaches and no shortness of breath.  Otalgia Associated symptoms: fever and sore throat   Associated symptoms: no abdominal pain, no headaches, no neck pain, no rash and no vomiting    patient with diagnosis of pharyngitis and bilateral ear infection started on penicillin. Patient states that rapid strep was negative. Culture was done results pending. Patient with severe throat pain. Had some fevers on and off. Difficulty swallowing but able to swallow. Denies any rash. Not urinating blood. Patient reports that he get strep throat frequently. Patient also states a Monospot test was done and it was negative.  Past Medical History  Diagnosis Date  . Asthma   . Arthritis   . Depression   . Hypertension   . History of chicken pox    History reviewed. No pertinent past surgical history. Family History  Problem Relation Age of Onset  . Diabetes Mother   . Alcohol abuse Neg Hx     family hx  . Arthritis Neg Hx     family hx  . Hypertension Neg Hx     family hx  . Mental illness Neg Hx     family hx   History  Substance Use Topics  . Smoking status: Never Smoker   . Smokeless tobacco: Never Used  . Alcohol Use: Yes    Review of Systems  Constitutional: Positive for fever.  HENT: Positive for ear pain, sore throat and trouble swallowing.   Eyes: Negative for redness.  Respiratory: Negative for shortness of breath.   Cardiovascular: Negative for chest pain.  Gastrointestinal: Negative for nausea, vomiting and  abdominal pain.  Genitourinary: Negative for dysuria and hematuria.  Musculoskeletal: Negative for back pain and neck pain.  Skin: Negative for rash.  Neurological: Negative for headaches.  Hematological: Does not bruise/bleed easily.  Psychiatric/Behavioral: Negative for confusion.      Allergies  Review of patient's allergies indicates no known allergies.  Home Medications   Prior to Admission medications   Medication Sig Start Date End Date Taking? Authorizing Provider  clindamycin (CLEOCIN) 150 MG capsule Take 3 capsules (450 mg total) by mouth 3 (three) times daily. 10/04/13   Vanetta Mulders, MD  clonazePAM (KLONOPIN) 0.5 MG tablet 1 at bedtime 12/16/10 11/14/11  Gordy Savers, MD  escitalopram (LEXAPRO) 10 MG tablet Take 15 mg by mouth daily.    Historical Provider, MD  HYDROcodone-acetaminophen (NORCO/VICODIN) 5-325 MG per tablet Take 1-2 tablets by mouth every 6 (six) hours as needed. 07/12/13   Elvina Sidle, MD  naproxen (NAPROSYN) 500 MG tablet Take 1 tablet (500 mg total) by mouth 2 (two) times daily. 01/01/12   Vanetta Mulders, MD  omeprazole (PRILOSEC) 40 MG capsule Take 40 mg by mouth daily.    Historical Provider, MD  oxyCODONE-acetaminophen (PERCOCET/ROXICET) 5-325 MG per tablet Take 1-2 tablets by mouth every 6 (six) hours as needed for pain. 04/23/12   Juliet Rude. Pickering, MD  oxyCODONE-acetaminophen (PERCOCET/ROXICET) 5-325 MG per tablet Take 1 tablet by mouth every 6 (six) hours as needed for moderate  pain or severe pain. 10/04/13   Vanetta Mulders, MD  phentermine 15 MG capsule Take 15 mg by mouth every morning.    Historical Provider, MD  promethazine (PHENERGAN) 25 MG tablet Take 1 tablet (25 mg total) by mouth every 6 (six) hours as needed for nausea. 01/01/12   Vanetta Mulders, MD  sertraline (ZOLOFT) 50 MG tablet Take 1 tablet (50 mg total) by mouth daily. 12/16/10 12/16/11  Gordy Savers, MD  SUMAtriptan (IMITREX) 25 MG tablet Take 25 mg by mouth  every 2 (two) hours as needed for migraine or headache. May repeat in 2 hours if headache persists or recurs.    Historical Provider, MD  topiramate (TOPAMAX) 50 MG tablet Take 1 tablet (50 mg total) by mouth 2 (two) times daily. 07/12/13   Elvina Sidle, MD   BP 131/68  Pulse 84  Temp(Src) 98.2 F (36.8 C) (Oral)  Resp 18  Ht  (1.778 m)  Wt 280 lb (127.007 kg)  BMI 40.18 kg/m2  SpO2 100% Physical Exam  Nursing note and vitals reviewed. Constitutional: He is oriented to person, place, and time. He appears well-developed and well-nourished. No distress.  HENT:  Head: Normocephalic and atraumatic.  Mouth/Throat: Oropharyngeal exudate present.  Symmetrical marked swelling of both tonsils with exudate uvula is midline no evidence clinically of a peritonsillar abscess.  Eyes: Conjunctivae and EOM are normal. Pupils are equal, round, and reactive to light.  Neck: Normal range of motion.  Cardiovascular: Normal rate, regular rhythm and normal heart sounds.   No murmur heard. Pulmonary/Chest: Effort normal and breath sounds normal. No respiratory distress.  Abdominal: Soft. Bowel sounds are normal. There is no tenderness.  Musculoskeletal: Normal range of motion.  Lymphadenopathy:    He has no cervical adenopathy.  Neurological: He is alert and oriented to person, place, and time. No cranial nerve deficit. He exhibits normal muscle tone. Coordination normal.  Skin: Skin is warm. No rash noted.    ED Course  Procedures (including critical care time) Labs Review Labs Reviewed  CBC WITH DIFFERENTIAL - Abnormal; Notable for the following:    WBC 13.8 (*)    Neutro Abs 10.5 (*)    Monocytes Absolute 1.4 (*)    All other components within normal limits  BASIC METABOLIC PANEL   Results for orders placed during the hospital encounter of 10/04/13  BASIC METABOLIC PANEL      Result Value Ref Range   Sodium 142  137 - 147 mEq/L   Potassium 4.4  3.7 - 5.3 mEq/L   Chloride 103  96 -  112 mEq/L   CO2 26  19 - 32 mEq/L   Glucose, Bld 99  70 - 99 mg/dL   BUN 12  6 - 23 mg/dL   Creatinine, Ser 4.09  0.50 - 1.35 mg/dL   Calcium 9.9  8.4 - 81.1 mg/dL   GFR calc non Af Amer >90  >90 mL/min   GFR calc Af Amer >90  >90 mL/min   Anion gap 13  5 - 15  CBC WITH DIFFERENTIAL      Result Value Ref Range   WBC 13.8 (*) 4.0 - 10.5 K/uL   RBC 5.60  4.22 - 5.81 MIL/uL   Hemoglobin 14.9  13.0 - 17.0 g/dL   HCT 91.4  78.2 - 95.6 %   MCV 80.4  78.0 - 100.0 fL   MCH 26.6  26.0 - 34.0 pg   MCHC 33.1  30.0 - 36.0 g/dL  RDW 14.4  11.5 - 15.5 %   Platelets 209  150 - 400 K/uL   Neutrophils Relative % 76  43 - 77 %   Neutro Abs 10.5 (*) 1.7 - 7.7 K/uL   Lymphocytes Relative 13  12 - 46 %   Lymphs Abs 1.7  0.7 - 4.0 K/uL   Monocytes Relative 10  3 - 12 %   Monocytes Absolute 1.4 (*) 0.1 - 1.0 K/uL   Eosinophils Relative 1  0 - 5 %   Eosinophils Absolute 0.1  0.0 - 0.7 K/uL   Basophils Relative 0  0 - 1 %   Basophils Absolute 0.0  0.0 - 0.1 K/uL     Imaging Review No results found.   EKG Interpretation None      MDM   Final diagnoses:  Strep pharyngitis    Patient with culture-positive strep pharyngitis as per patient from his primary care's office. Patient been on penicillin since Tuesday not improving. Patient gets strep frequently about 3 times a year. Patient states he only gets better on clindamycin. Patient with significant tonsil enlargement and exudate. No midline shift of the uvula no evidence of peritonsillar abscess at this time. Patient is nontoxic no acute distress.    Vanetta Mulders, MD 10/04/13 1428

## 2013-10-04 NOTE — ED Notes (Signed)
Pt upset, wanted IV out, stating he wanted to leave against medical advice.  Informed Dr. Deretha Emory.  He will see the patient ASAP.  Informed pt regarding plan of care, pt will wait a little longer.

## 2013-10-18 LAB — HM COLONOSCOPY

## 2014-02-06 ENCOUNTER — Encounter: Payer: Self-pay | Admitting: Internal Medicine

## 2014-02-06 ENCOUNTER — Ambulatory Visit (INDEPENDENT_AMBULATORY_CARE_PROVIDER_SITE_OTHER): Payer: BLUE CROSS/BLUE SHIELD | Admitting: Internal Medicine

## 2014-02-06 VITALS — BP 122/88 | HR 83 | Temp 98.3°F | Ht 70.25 in | Wt 283.0 lb

## 2014-02-06 DIAGNOSIS — R634 Abnormal weight loss: Secondary | ICD-10-CM

## 2014-02-06 DIAGNOSIS — K625 Hemorrhage of anus and rectum: Secondary | ICD-10-CM

## 2014-02-06 DIAGNOSIS — Z131 Encounter for screening for diabetes mellitus: Secondary | ICD-10-CM

## 2014-02-06 DIAGNOSIS — M1710 Unilateral primary osteoarthritis, unspecified knee: Secondary | ICD-10-CM | POA: Insufficient documentation

## 2014-02-06 DIAGNOSIS — J4520 Mild intermittent asthma, uncomplicated: Secondary | ICD-10-CM

## 2014-02-06 DIAGNOSIS — IMO0001 Reserved for inherently not codable concepts without codable children: Secondary | ICD-10-CM | POA: Insufficient documentation

## 2014-02-06 DIAGNOSIS — R030 Elevated blood-pressure reading, without diagnosis of hypertension: Secondary | ICD-10-CM

## 2014-02-06 DIAGNOSIS — R51 Headache: Secondary | ICD-10-CM

## 2014-02-06 DIAGNOSIS — M129 Arthropathy, unspecified: Secondary | ICD-10-CM

## 2014-02-06 DIAGNOSIS — F32A Depression, unspecified: Secondary | ICD-10-CM

## 2014-02-06 DIAGNOSIS — F329 Major depressive disorder, single episode, unspecified: Secondary | ICD-10-CM

## 2014-02-06 DIAGNOSIS — R519 Headache, unspecified: Secondary | ICD-10-CM | POA: Insufficient documentation

## 2014-02-06 DIAGNOSIS — R110 Nausea: Secondary | ICD-10-CM

## 2014-02-06 LAB — CBC
HCT: 46.5 % (ref 39.0–52.0)
HEMOGLOBIN: 15.2 g/dL (ref 13.0–17.0)
MCHC: 32.7 g/dL (ref 30.0–36.0)
MCV: 80.8 fl (ref 78.0–100.0)
PLATELETS: 250 10*3/uL (ref 150.0–400.0)
RBC: 5.75 Mil/uL (ref 4.22–5.81)
RDW: 13.6 % (ref 11.5–15.5)
WBC: 7.1 10*3/uL (ref 4.0–10.5)

## 2014-02-06 LAB — COMPREHENSIVE METABOLIC PANEL
ALBUMIN: 4.5 g/dL (ref 3.5–5.2)
ALT: 15 U/L (ref 0–53)
AST: 18 U/L (ref 0–37)
Alkaline Phosphatase: 72 U/L (ref 39–117)
BILIRUBIN TOTAL: 0.5 mg/dL (ref 0.2–1.2)
BUN: 17 mg/dL (ref 6–23)
CHLORIDE: 104 meq/L (ref 96–112)
CO2: 28 mEq/L (ref 19–32)
CREATININE: 1.01 mg/dL (ref 0.40–1.50)
Calcium: 9.9 mg/dL (ref 8.4–10.5)
GFR: 116.02 mL/min (ref >60.00)
GLUCOSE: 99 mg/dL (ref 70–99)
Potassium: 4.5 mEq/L (ref 3.5–5.1)
Sodium: 139 mEq/L (ref 135–145)
Total Protein: 7.8 g/dL (ref 6.0–8.3)

## 2014-02-06 LAB — H. PYLORI ANTIBODY, IGG: H Pylori IgG: NEGATIVE

## 2014-02-06 LAB — HEMOGLOBIN A1C: Hgb A1c MFr Bld: 5.6 % (ref 4.6–6.5)

## 2014-02-06 MED ORDER — FLUOXETINE HCL 20 MG PO TABS: 20.0000 mg | ORAL_TABLET | Freq: Every day | ORAL | Status: DC

## 2014-02-06 NOTE — Assessment & Plan Note (Signed)
He reports they feel like migraines Never had a head CT or evaluated by neurology Failed imitrex and excedrin Consider topamax to reduce frequency of headaches, may also help with depression- will discuss this at next visit

## 2014-02-06 NOTE — Assessment & Plan Note (Signed)
Weight loss may be beneficial No need to medicate at this time

## 2014-02-06 NOTE — Assessment & Plan Note (Signed)
S/p arthroscopic surgery Not improved per pt He does not take medication for this No need to have him seen by orthopedics at this time

## 2014-02-06 NOTE — Patient Instructions (Signed)

## 2014-02-06 NOTE — Assessment & Plan Note (Signed)
He wants to restart phentermine Give the treatment with SSRI and recent unintentional weight loss, advised him we would not restart this at this time

## 2014-02-06 NOTE — Progress Notes (Signed)
HPI  Pt presents to the clinic today to establish care. He was being seen at [facility] but reports he has not been seen there in the last year.  Flu: 09/2013 Tetanus: > 10 years Dentist: as needed  Asthma: Takes proair with physical activity. Denies shortness of breath.  Arthritis: Pt reports he has arthritis of right knee. He had a torn meniscus. He reports he had an arthroscopic surgery where they shaved down his kneecap. This was in 2012. He does not taken anything for pain.  Depression: Has been on Lexapro for the last year. He is taking double the prescribed amount and he reports it is not effective. He has also failed Zoloft in the past. He does not see a therapist. He reports that work causes him some depression and he recently just moved out on his own for the first time which may be contributing to his symptoms. He reports he was diagnosed with Bipolar at age 24, but never medicated. His mother has bipolar schizophrenic and medicated. He was seen by a psychiatrist last year who told him that he was not bipolar but just depressed.  HTN: He reports his blood pressures have been 120's/103. He has ever been medicated.  Frequent Headaches: He reports he is getting a headache every other day or every day. The headaches starts on the left side of the head and radiates to the back of the head. He describes it as a pounding sensation. He does have nausea and vomiting. He also has sensitivity to light and sound. He has failed excedrin migraine and imitrex.  Weight Loss: He reports he has lost 32 lbs in a month and a half without trying. He reports he does not have an appetite. He has had rectal bleeding. This has occurred every day for the last week. The blood is a small amount but bright red. He does have anal intercourse. He has been stressed lately. He does have a family history of colon cancer in his father and grandfather. He has been evaluated by GI. He did have colonoscopy which showed  internal hemorrhoids. He last saw GI 09/2013.   Past Medical History  Diagnosis Date  . Asthma   . Arthritis   . Depression   . Hypertension   . History of chicken pox   . Frequent headaches     Current Outpatient Prescriptions  Medication Sig Dispense Refill  . escitalopram (LEXAPRO) 20 MG tablet     . promethazine (PHENERGAN) 25 MG tablet Take 1 tablet (25 mg total) by mouth every 6 (six) hours as needed for nausea. 30 tablet 0  . SUMAtriptan (IMITREX) 25 MG tablet Take 50 mg by mouth every 2 (two) hours as needed for migraine or headache. May repeat in 2 hours if headache persists or recurs.    . phentermine 15 MG capsule Take 15 mg by mouth every morning.     No current facility-administered medications for this visit.    No Known Allergies  Family History  Problem Relation Age of Onset  . Diabetes Mother   . Arthritis Mother   . Hyperlipidemia Mother   . Hypertension Mother   . Mental illness Mother   . Alcohol abuse Father   . Drug abuse Father   . Cancer Father     colon  . Hyperlipidemia Father   . Arthritis Maternal Grandmother   . Hyperlipidemia Maternal Grandmother   . Arthritis Maternal Grandfather   . Hyperlipidemia Maternal Grandfather   . Arthritis  Paternal Grandmother   . Hyperlipidemia Paternal Grandmother   . Hypertension Paternal Grandmother   . Arthritis Paternal Grandfather   . Cancer Paternal Grandfather     colon and prostate  . Hyperlipidemia Paternal Grandfather   . Diabetes Paternal Grandfather     History   Social History  . Marital Status: Single    Spouse Name: N/A    Number of Children: N/A  . Years of Education: N/A   Occupational History  . Not on file.   Social History Main Topics  . Smoking status: Never Smoker   . Smokeless tobacco: Never Used  . Alcohol Use: 0.0 oz/week    0 Not specified per week     Comment: social  . Drug Use: No  . Sexual Activity: Not on file   Other Topics Concern  . Not on file    Social History Narrative    ROS:  Constitutional: Pt reports fatigue. Denies fever, malaise,  headache or abrupt weight changes.  HEENT: Denies eye pain, eye redness, ear pain, ringing in the ears, wax buildup, runny nose, nasal congestion, bloody nose, or sore throat. Respiratory: Denies difficulty breathing, shortness of breath, cough or sputum production.   Cardiovascular: Denies chest pain, chest tightness, palpitations or swelling in the hands or feet.  Gastrointestinal: Pt reports abdominal pain and blood in stool. Denies bloating, constipation, diarrhea.  GU: Denies frequency, urgency, pain with urination, blood in urine, odor or discharge. Musculoskeletal: Pt reports right knee pain. Denies decrease in range of motion, difficulty with gait, muscle pain or joint and swelling.  Skin: Denies redness, rashes, lesions or ulcercations.  Neurological: Denies dizziness, difficulty with memory, difficulty with speech or problems with balance and coordination.  Psych: Pt reports depression. Denies anxiety, SI/HI.  No other specific complaints in a complete review of systems (except as listed in HPI above).  PE:  Pulse 83  Temp(Src) 98.3 F (36.8 C) (Oral)  Ht 5' 10.25" (1.784 m)  Wt 283 lb (128.368 kg)  BMI 40.33 kg/m2  SpO2 98% Wt Readings from Last 3 Encounters:  02/06/14 283 lb (128.368 kg)  10/04/13 280 lb (127.007 kg)  07/12/13 297 lb (134.718 kg)    General: Appears his stated age, obese in NAD. HEENT: Head: normal shape and size; Eyes: sclera white, no icterus, conjunctiva pink; Ears: Tm's gray and intact, normal light reflex; Nose: mucosa pink and moist, septum midline; Throat/Mouth: Teeth present, mucosa pink and moist, no lesions or ulcerations noted.  Cardiovascular: Normal rate and rhythm. S1,S2 noted.  No murmur, rubs or gallops noted.  Pulmonary/Chest: Normal effort and positive vesicular breath sounds. No respiratory distress. No wheezes, rales or ronchi noted.   Abdomen: Soft and diffusely tender. Normal bowel sounds, no bruits noted. No distention or masses noted. Liver, spleen and kidneys non palpable. Rectal: No external hemorrhoid noted. Normal rectal tone. No internal mass or fissure noted. No blood on exam glove once removed. No stool obtained for hemoccult testing.  Musculoskeletal: Normal flexion, extension or the right knee. 5/5 BUE/BLE. No difficulty with gait.  Neurological: Alert and oriented.  Psychiatric: Mood and affect flat. Behavior is normal. Judgment and thought content normal.    BMET    Component Value Date/Time   NA 142 10/04/2013 1150   K 4.4 10/04/2013 1150   CL 103 10/04/2013 1150   CO2 26 10/04/2013 1150   GLUCOSE 99 10/04/2013 1150   BUN 12 10/04/2013 1150   CREATININE 1.00 10/04/2013 1150   CREATININE 1.11  04/12/2011 1922   CALCIUM 9.9 10/04/2013 1150   GFRNONAA >90 10/04/2013 1150   GFRAA >90 10/04/2013 1150    Lipid Panel  No results found for: CHOL, TRIG, HDL, CHOLHDL, VLDL, LDLCALC  CBC    Component Value Date/Time   WBC 13.8* 10/04/2013 1150   WBC 7.2 04/12/2011 1931   RBC 5.60 10/04/2013 1150   RBC 6.10 04/12/2011 1931   HGB 14.9 10/04/2013 1150   HGB 15.9 04/12/2011 1931   HCT 45.0 10/04/2013 1150   HCT 49.0 04/12/2011 1931   PLT 209 10/04/2013 1150   MCV 80.4 10/04/2013 1150   MCV 80.4 04/12/2011 1931   MCH 26.6 10/04/2013 1150   MCH 26.1* 04/12/2011 1931   MCHC 33.1 10/04/2013 1150   MCHC 32.4 04/12/2011 1931   RDW 14.4 10/04/2013 1150   LYMPHSABS 1.7 10/04/2013 1150   MONOABS 1.4* 10/04/2013 1150   EOSABS 0.1 10/04/2013 1150   BASOSABS 0.0 10/04/2013 1150    Hgb A1C No results found for: HGBA1C   Assessment and Plan:  Loss of weight, nausea, rectal bleeding:  ? If this is related to worsening depression causing a worsening of his IBS Will obtain GI notes to review No s/s of rectal bleeding on exam today Continue promethazine for now Will check HIV, RPR, CMET, and H  Pylori

## 2014-02-06 NOTE — Assessment & Plan Note (Signed)
Chronic with moderate severity If there was any concern that he was bipolar, would avoid the use of SSRI Advised him to let me refer him to psychiatry- he declines stating that he does not have the money for this Stop Lexapro Start Prozac 20 mg QHS Watch for worsening depression or SI- if this occurs stop medication and call me immediately

## 2014-02-06 NOTE — Assessment & Plan Note (Signed)
Occasional albuterol use Well controlled

## 2014-02-07 LAB — HIV ANTIBODY (ROUTINE TESTING W REFLEX): HIV: NONREACTIVE

## 2014-02-07 LAB — RPR

## 2014-02-08 ENCOUNTER — Encounter: Payer: Self-pay | Admitting: Internal Medicine

## 2014-02-10 ENCOUNTER — Other Ambulatory Visit: Payer: Self-pay | Admitting: Internal Medicine

## 2014-02-10 MED ORDER — ONDANSETRON HCL 4 MG PO TABS: 4.0000 mg | ORAL_TABLET | Freq: Three times a day (TID) | ORAL | Status: DC | PRN

## 2014-02-12 ENCOUNTER — Encounter: Payer: Self-pay | Admitting: Internal Medicine

## 2014-02-13 ENCOUNTER — Encounter: Payer: Self-pay | Admitting: Internal Medicine

## 2014-02-13 ENCOUNTER — Ambulatory Visit (INDEPENDENT_AMBULATORY_CARE_PROVIDER_SITE_OTHER): Payer: BLUE CROSS/BLUE SHIELD | Admitting: Internal Medicine

## 2014-02-13 ENCOUNTER — Other Ambulatory Visit: Payer: Self-pay

## 2014-02-13 ENCOUNTER — Other Ambulatory Visit: Payer: Self-pay | Admitting: Internal Medicine

## 2014-02-13 VITALS — BP 122/90 | HR 120 | Temp 99.7°F | Wt 284.0 lb

## 2014-02-13 DIAGNOSIS — J029 Acute pharyngitis, unspecified: Secondary | ICD-10-CM

## 2014-02-13 DIAGNOSIS — J039 Acute tonsillitis, unspecified: Secondary | ICD-10-CM

## 2014-02-13 DIAGNOSIS — G43011 Migraine without aura, intractable, with status migrainosus: Secondary | ICD-10-CM

## 2014-02-13 DIAGNOSIS — B9789 Other viral agents as the cause of diseases classified elsewhere: Secondary | ICD-10-CM

## 2014-02-13 DIAGNOSIS — J069 Acute upper respiratory infection, unspecified: Secondary | ICD-10-CM

## 2014-02-13 LAB — POCT INFLUENZA A/B
INFLUENZA A, POC: NEGATIVE
Influenza B, POC: NEGATIVE

## 2014-02-13 LAB — POCT RAPID STREP A (OFFICE): RAPID STREP A SCREEN: NEGATIVE

## 2014-02-13 MED ORDER — PROMETHAZINE HCL 12.5 MG PO TABS: 12.5000 mg | ORAL_TABLET | Freq: Four times a day (QID) | ORAL | Status: DC | PRN

## 2014-02-13 MED ORDER — OMEPRAZOLE 20 MG PO CPDR: 20.0000 mg | DELAYED_RELEASE_CAPSULE | Freq: Every day | ORAL | Status: DC

## 2014-02-13 MED ORDER — PREDNISONE 10 MG PO TABS: ORAL_TABLET | ORAL | Status: DC

## 2014-02-13 MED ORDER — TOPIRAMATE 50 MG PO TABS: 50.0000 mg | ORAL_TABLET | Freq: Two times a day (BID) | ORAL | Status: DC

## 2014-02-13 NOTE — Addendum Note (Signed)
Addended by: Roena MaladyEVONTENNO, Avonell Lenig Y on: 02/13/2014 11:05 AM   Modules accepted: Orders

## 2014-02-13 NOTE — Progress Notes (Signed)
HPI  Pt presents to the clinic today with c/o sore throat, fever and chills. He reports this started 2 days ago. He reports that he is having difficulty swallowing and his chest hurts when he breaths. He is blowing green mucous out of his nose. He is also coughing up green mucous. He reports his fever has gotten as high as 103.1. He has taken clindamycin TID for the last 48 hours without relief. He also took Tylenol which did seem to help with the fever, chills and body aches. He does have a history of asthma but denies allergies. He has had sick contacts. He has had his flu shot.  Review of Systems      Past Medical History  Diagnosis Date  . Asthma   . Arthritis   . Depression   . Hypertension   . History of chicken pox   . Frequent headaches     Family History  Problem Relation Age of Onset  . Diabetes Mother   . Arthritis Mother   . Hyperlipidemia Mother   . Hypertension Mother   . Mental illness Mother   . Alcohol abuse Father   . Drug abuse Father   . Cancer Father     colon  . Hyperlipidemia Father   . Arthritis Maternal Grandmother   . Hyperlipidemia Maternal Grandmother   . Arthritis Maternal Grandfather   . Hyperlipidemia Maternal Grandfather   . Arthritis Paternal Grandmother   . Hyperlipidemia Paternal Grandmother   . Hypertension Paternal Grandmother   . Arthritis Paternal Grandfather   . Cancer Paternal Grandfather     colon and prostate  . Hyperlipidemia Paternal Grandfather   . Diabetes Paternal Grandfather     History   Social History  . Marital Status: Single    Spouse Name: N/A    Number of Children: N/A  . Years of Education: N/A   Occupational History  . Not on file.   Social History Main Topics  . Smoking status: Never Smoker   . Smokeless tobacco: Never Used  . Alcohol Use: 0.0 oz/week    0 Not specified per week     Comment: social  . Drug Use: No  . Sexual Activity: Yes   Other Topics Concern  . Not on file   Social History  Narrative    No Known Allergies   Constitutional: Positive headache, fatigue and fever. Denies abrupt weight changes.  HEENT:  Positive runny nose, sore throat. Denies eye redness, eye pain, pressure behind the eyes, facial pain, nasal congestion, ear pain, ringing in the ears, wax buildup or bloody nose. Respiratory: Pt reports cough. Denies shortness of breath.  Cardiovascular: Denies chest pain, chest tightness, palpitations or swelling in the hands or feet.   No other specific complaints in a complete review of systems (except as listed in HPI above).  Objective:   BP 122/90 mmHg  Pulse 120  Temp(Src) 99.7 F (37.6 C) (Oral)  Wt 284 lb (128.822 kg)  SpO2 98% Wt Readings from Last 3 Encounters:  02/13/14 284 lb (128.822 kg)  02/06/14 283 lb (128.368 kg)  10/04/13 280 lb (127.007 kg)     General: Appears his stated age, ill appearing in NAD. HEENT: Head: normal shape and size; Eyes: sclera white, no icterus, conjunctiva pink; Ears: Tm's gray and intact, normal light reflex, + effusion bilaterally; Nose: mucosa pink and moist, septum midline; Throat/Mouth: + PND. Teeth present, mucosa erythematous and moist, tonsil 3 +, no exudate noted, no lesions or ulcerations  noted.  Neck: No lymphadenopathy.  Cardiovascular: Tachycardia with normal rhythm. S1,S2 noted.  No murmur, rubs or gallops noted.  Pulmonary/Chest: Normal effort and positive vesicular breath sounds. No respiratory distress. No wheezes, rales or ronchi noted.      Assessment & Plan:   Acute tonsillitis/viral URI:  RST: negative Rapid Flu: negative Get some rest and drink plenty of water Do salt water gargles/Ibuprofen for the sore throat eRx for pred taper for inflammation Advised him to stop taking the Clindamycin Tylenol as needed for fever/body aches   RTC as needed or if symptoms persist.

## 2014-02-13 NOTE — Patient Instructions (Signed)

## 2014-02-13 NOTE — Progress Notes (Signed)
Pre visit review using our clinic review tool, if applicable. No additional management support is needed unless otherwise documented below in the visit note. 

## 2014-02-15 LAB — CULTURE, GROUP A STREP: ORGANISM ID, BACTERIA: NORMAL

## 2014-02-19 ENCOUNTER — Telehealth: Payer: Self-pay | Admitting: Nurse Practitioner

## 2014-02-19 DIAGNOSIS — J039 Acute tonsillitis, unspecified: Secondary | ICD-10-CM

## 2014-02-20 ENCOUNTER — Encounter: Payer: Self-pay | Admitting: Internal Medicine

## 2014-02-20 NOTE — Progress Notes (Signed)
Based on what you shared with me it looks like you have a serious condition that should be evaluated in a face to face office visit.  If you are having a true medical emergency please call 911.  If you need an urgent face to face visit, [facility] has four urgent care centers for your convenience.  . [facility]  [phone] Get Driving Directions Find a Provider at this Location  [address] [place], [place] [address] . 8 am to 8 pm Monday-Friday . 9 am to 7 pm Saturday-Sunday  . [facility]  [phone] Get Driving Directions Find a Provider at this Location  [address], [address] [place], [place] [address] . 8 am to 8 pm Monday-Friday . 9 am to 6 pm Saturday . 11 am to 6 pm Sunday   .  [facility] [place]  [phone] Get Driving Directions  [address] [place], [place] [address] . 8 am to 8 pm Monday-Friday . 9 am to 4 pm Saturday-Sunday   . [facility] (a walk in primary care provider)  [phone]  Get Driving Directions Find a Provider at this Location  [address] [place], [place] [address] . 8 am to 8:30 pm Monday-Thursday . 8 am to 6 pm Friday . 8 am to 4 pm Saturday-Sunday   Your e-visit answers were reviewed by a board certified advanced clinical practitioner to complete your personal care plan.  Depending on the condition, your plan could have included both over the counter or prescription medications.  You will get an e-mail in the next two days asking about your experience.  I hope that your e-visit has been valuable and will speed your recovery . Thank you for choosing an e-visit.    

## 2014-02-26 ENCOUNTER — Encounter: Payer: Self-pay | Admitting: Internal Medicine

## 2014-03-07 ENCOUNTER — Encounter: Payer: Self-pay | Admitting: Internal Medicine

## 2014-03-07 MED ORDER — FLUOXETINE HCL 40 MG PO CAPS: 40.0000 mg | ORAL_CAPSULE | Freq: Every day | ORAL | Status: DC

## 2014-03-12 ENCOUNTER — Telehealth: Payer: Self-pay | Admitting: Internal Medicine

## 2014-03-12 DIAGNOSIS — Z7689 Persons encountering health services in other specified circumstances: Secondary | ICD-10-CM

## 2014-03-12 NOTE — Telephone Encounter (Signed)
Pt dropped off fmla form for work.  Please fill out and pt is requesting that you fax directly to [phone] if possible.  Thanks. Placing on Melanie's desk

## 2014-03-13 NOTE — Telephone Encounter (Signed)
Forms faxed.  LVm for pt stating forms were faxed and copy at front desk for pick up. Copy to scan, copy for charge, and copy for my file

## 2014-03-26 ENCOUNTER — Other Ambulatory Visit: Payer: Self-pay

## 2014-03-26 NOTE — Telephone Encounter (Signed)
He should have enough refills until May. We can give him a 90 day supply at that time

## 2014-03-26 NOTE — Telephone Encounter (Signed)
Last filled 03/07/14 with 2 refills--last OV 02/13/14, no upcoming appts--request for 90 day supply--please advise

## 2014-04-02 ENCOUNTER — Encounter: Payer: Self-pay | Admitting: Internal Medicine

## 2014-04-02 ENCOUNTER — Ambulatory Visit (INDEPENDENT_AMBULATORY_CARE_PROVIDER_SITE_OTHER): Payer: BLUE CROSS/BLUE SHIELD | Admitting: Internal Medicine

## 2014-04-02 VITALS — BP 128/74 | HR 89 | Temp 98.3°F | Wt 288.2 lb

## 2014-04-02 DIAGNOSIS — R41840 Attention and concentration deficit: Secondary | ICD-10-CM

## 2014-04-02 DIAGNOSIS — R454 Irritability and anger: Secondary | ICD-10-CM

## 2014-04-02 DIAGNOSIS — R451 Restlessness and agitation: Secondary | ICD-10-CM

## 2014-04-02 DIAGNOSIS — K648 Other hemorrhoids: Secondary | ICD-10-CM

## 2014-04-02 DIAGNOSIS — R4586 Emotional lability: Secondary | ICD-10-CM

## 2014-04-02 DIAGNOSIS — F39 Unspecified mood [affective] disorder: Secondary | ICD-10-CM

## 2014-04-02 DIAGNOSIS — G4700 Insomnia, unspecified: Secondary | ICD-10-CM
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
## 2014-04-02 NOTE — Patient Instructions (Signed)
Anger Management Anger is a normal human emotion. However, anger can range from mild irritation to rage. When your anger becomes harmful to yourself or others, it is unhealthy anger.  CAUSES  There are many reasons for unhealthy anger. Many people learn how to express anger from observing how their family expressed anger. In troubled, chaotic, or abusive families, anger can be expressed as rage or even violence. Children can grow up never learning how healthy anger can be expressed. Factors that contribute to unhealthy anger include:   Drug or alcohol abuse.  Post-traumatic stress disorder.  Traumatic brain injury. COMPLICATIONS  People with unhealthy anger tend to overreact and retaliate against a real or imagined threat. The need to retaliate can turn into violence or verbal abuse against another person. Chronic anger can lead to health problems, such as hypertension, high blood pressure, and depression. TREATMENT  Exercising, relaxing, meditating, or writing out your feelings all can be beneficial in managing moderate anger. For unhealthy anger, the following methods may be used:  Cognitive-behavioral counseling (learning skills to change the thoughts that influence your mood).  Relaxation training.  Interpersonal counseling.  Assertive communication skills.  Medication. Document Released: 10/31/2006 Document Revised: 03/28/2011 Document Reviewed: 03/11/2010 ExitCare Patient Information 2015 [employer]. This information is not intended to replace advice given to you by your health care provider. Make sure you discuss any questions you have with your health care provider.  

## 2014-04-02 NOTE — Progress Notes (Signed)
Pre visit review using our clinic review tool, if applicable. No additional management support is needed unless otherwise documented below in the visit note. 

## 2014-04-03 ENCOUNTER — Telehealth: Payer: Self-pay | Admitting: Internal Medicine

## 2014-04-03 ENCOUNTER — Encounter: Payer: Self-pay | Admitting: Internal Medicine

## 2014-04-03 NOTE — Telephone Encounter (Signed)
Called patient to give him his Psychiatric appt with Dr Caryn SectionAarti Kapur on 05/02/14 at 12:30. While on the phone with him Dr Maryruth BunKapur called Nicki Reaperegina Baity after reviewing patients notes recommending him to go to [facility] today for an assessment. Gave the patient the address and he said he will go there today to be seen.

## 2014-04-10 ENCOUNTER — Telehealth: Payer: Self-pay | Admitting: Internal Medicine

## 2014-04-10 NOTE — Telephone Encounter (Signed)
Work [phone] Pt called to let you know he went to [facility] in  He wanted to know if you could call in his med now  [facility]

## 2014-04-10 NOTE — Telephone Encounter (Signed)
I have to get the notes. Does he know if they are faxing them to me?

## 2014-04-14 NOTE — Telephone Encounter (Signed)
Spoke with patient and he states that he signed an ROI asking them to fax the notes to our clinic.  Patient was advised that Nicki ReaperRegina Baity is out of the office this week but that hopefully those notes will come in this week and she can review them and be in touch with the patient next week.

## 2014-04-23 ENCOUNTER — Telehealth: Payer: Self-pay | Admitting: Internal Medicine

## 2014-04-23 MED ORDER — ARIPIPRAZOLE 15 MG PO TABS: 15.0000 mg | ORAL_TABLET | Freq: Every day | ORAL | Status: DC

## 2014-04-23 NOTE — Telephone Encounter (Signed)
Call pt:  I received his notes from [facility]. I am going to start him on treatment for Bipolar. He should start Abilify 15 mg daily, and I want him to contact me in 4 weeks to let me know how he is doing, sooner if he has SI or seems worse

## 2014-04-23 NOTE — Telephone Encounter (Signed)
Pt is aware and expressed understanding 

## 2014-04-28 ENCOUNTER — Encounter: Payer: Self-pay | Admitting: Internal Medicine

## 2014-04-29 ENCOUNTER — Telehealth: Payer: Self-pay | Admitting: *Deleted

## 2014-04-29 NOTE — Telephone Encounter (Signed)
Patient left a voicemail stating that he was to see a psychiatrist this Friday and they called today and he can not be seen for a month. Patient stated that he is having problems with the Abilify and wants to know what he should do?

## 2014-04-30 ENCOUNTER — Encounter: Payer: Self-pay | Admitting: Gastroenterology

## 2014-04-30 NOTE — Telephone Encounter (Signed)
See if he can go back to the place who advised him to start the Abilify. Why did his appt get pushed back.

## 2014-04-30 NOTE — Telephone Encounter (Signed)
Pt states the office called to reschedule appt but did not tell him why--he states you were the one who prescribed so that is why he called you--please advise

## 2014-04-30 NOTE — Telephone Encounter (Signed)
I know I prescribed it until he could get in with the psychiatrist. That is a psych med and should be titrated by a psychiatrist. Did cutting it in half make a difference? [facility]? They rescheduled his appt.

## 2014-04-30 NOTE — Telephone Encounter (Signed)
Spoke to pt and he states he did cut in half and had no improvement in Sx---he also states he did not take medication last night so he could get some sleep--i told pt that a note had been sent to referral coord. To see if appt can be pushed sooner

## 2014-05-01 ENCOUNTER — Telehealth: Payer: Self-pay | Admitting: Internal Medicine

## 2014-05-01 DIAGNOSIS — F3162 Bipolar disorder, current episode mixed, moderate: Secondary | ICD-10-CM

## 2014-05-01 MED ORDER — DIVALPROEX SODIUM 250 MG PO DR TAB: DELAYED_RELEASE_TABLET | ORAL | Status: DC

## 2014-05-01 MED ORDER — DIVALPROEX SODIUM 500 MG PO DR TAB: 500.0000 mg | DELAYED_RELEASE_TABLET | Freq: Two times a day (BID) | ORAL | Status: DC

## 2014-05-01 NOTE — Telephone Encounter (Signed)
Called pt and he states he had already done 2 days of 15mg  and 1 day of 7-5mg  before he called you and has not taken Abilify x 2 days--pt is aware of other instructions and made a lab only appt x 1 week and advise to f/u with you via telephone if he has any problems with Sx or Depakote

## 2014-05-01 NOTE — Telephone Encounter (Signed)
Received a call form Dr. Maryruth BunKapur, she had to reschedule Zachary Sanford's appt until next month. He is having issues with Abilify, causing jitteriness and insomnia. She advised to wean of abilify and start depakote. Per her instructions:  Take abilify 15 mg daily x 2 days, then 1/2 tab which is 7.5 mg daily x 2 days then stop. In the meantime he should start depakote 500 mg 8 am, 1 pm and 250 mg at 5 pm. He needs to make a lab appt at 8 am next Thursday for depakote level. He needs to update me in 1 week on how he is doing.

## 2014-05-02 NOTE — Telephone Encounter (Signed)
Noted thank you

## 2014-05-02 NOTE — Telephone Encounter (Signed)
Per Nicki Reaperegina Baity she spoke directly with Dr Maryruth BunKapur and got recommendations from Dr Maryruth BunKapur to adjust patients medication and called the patient to change it. Patient aware.

## 2014-05-08 ENCOUNTER — Other Ambulatory Visit (INDEPENDENT_AMBULATORY_CARE_PROVIDER_SITE_OTHER): Payer: BLUE CROSS/BLUE SHIELD

## 2014-05-08 DIAGNOSIS — F3162 Bipolar disorder, current episode mixed, moderate: Secondary | ICD-10-CM | POA: Diagnosis not present

## 2014-05-09 LAB — VALPROIC ACID LEVEL: Valproic Acid Lvl: 34 ug/mL — ABNORMAL LOW (ref 50.0–100.0)

## 2014-05-28 ENCOUNTER — Other Ambulatory Visit: Payer: Self-pay

## 2014-05-28 MED ORDER — OMEPRAZOLE 20 MG PO CPDR: 20.0000 mg | DELAYED_RELEASE_CAPSULE | Freq: Every day | ORAL | Status: DC

## 2014-06-27 ENCOUNTER — Ambulatory Visit: Payer: BLUE CROSS/BLUE SHIELD | Admitting: Gastroenterology

## 2014-07-14 ENCOUNTER — Telehealth: Payer: Self-pay

## 2014-07-14 ENCOUNTER — Other Ambulatory Visit: Payer: Self-pay | Admitting: Internal Medicine

## 2014-07-14 ENCOUNTER — Ambulatory Visit (INDEPENDENT_AMBULATORY_CARE_PROVIDER_SITE_OTHER): Payer: 59 | Admitting: Internal Medicine

## 2014-07-14 ENCOUNTER — Encounter: Payer: Self-pay | Admitting: Internal Medicine

## 2014-07-14 VITALS — BP 126/90 | HR 70 | Temp 98.6°F | Wt 309.5 lb

## 2014-07-14 DIAGNOSIS — K629 Disease of anus and rectum, unspecified: Secondary | ICD-10-CM | POA: Diagnosis not present

## 2014-07-14 MED ORDER — IMIQUIMOD 2.5 % EX CREA: 1.0000 "application " | TOPICAL_CREAM | Freq: Every day | CUTANEOUS | Status: DC

## 2014-07-14 MED ORDER — PODOFILOX 0.5 % EX GEL: Freq: Two times a day (BID) | CUTANEOUS | Status: DC

## 2014-07-14 NOTE — Patient Instructions (Signed)
Molluscum Contagiosum Molluscum contagiosum is a viral infection of the skin that causes smooth surfaced, firm, small (3 to 5 mm), dome-shaped bumps (papules) which are flesh-colored. The bumps usually do not hurt or itch. In children, they most often appear on the face, trunk, arms and legs. In adults, the growths are commonly found on the genitals, thighs, face, neck, and belly (abdomen). The infection may be spread to others by close (skin to skin) contact (such as occurs in schools and swimming pools), sharing towels and clothing, and through sexual contact. The bumps usually disappear without treatment in 2 to 4 months, especially in children. You may have them treated to avoid spreading them. Scraping (curetting) the middle part (central plug) of the bump with a needle or sharp curette, or application of liquid nitrogen for 8 or 9 seconds usually cures the infection. HOME CARE INSTRUCTIONS   Do not scratch the bumps. This may spread the infection to other parts of the body and to other people.  Avoid close contact with others, including sexual contact, until the bumps disappear. Do not share towels or clothing.  If liquid nitrogen was used, blisters will form. Leave the blisters alone and cover with a bandage. The tops will fall off by themselves in 7 to 14 days.  Four months without a lesion is usually a cure. SEEK IMMEDIATE MEDICAL CARE IF:  You have a fever.  You develop swelling, redness, pain, tenderness, or warmth in the areas of the bumps. They may be infected. Document Released: 01/01/2000 Document Revised: 03/28/2011 Document Reviewed: 06/13/2008 ExitCare Patient Information 2015 [employer]. This information is not intended to replace advice given to you by your health care provider. Make sure you discuss any questions you have with your health care provider.  

## 2014-07-14 NOTE — Progress Notes (Signed)
Subjective:    Patient ID: Zachary Sanford, male    DOB: 11-Nov-1990, 24 y.o.   MRN: [id number]  HPI  Pt presents to the clinic today with c/o a rash. He noticed this 3 days ago. The rash is around his anus. He reports it looks like little white bumps. It does not itch or burn. He has not put anything on it. He has never had a rash like this before. He is sexually active and does have anal intercourse but reports his partner does not have any similar rashes.  Review of Systems      Past Medical History  Diagnosis Date  . Asthma   . Arthritis   . Depression   . Hypertension   . History of chicken pox   . Frequent headaches     Current Outpatient Prescriptions  Medication Sig Dispense Refill  . divalproex (DEPAKOTE ER) 500 MG 24 hr tablet Take 500 mg by mouth 3 (three) times daily.    Marland Kitchen omeprazole (PRILOSEC) 20 MG capsule Take 1 capsule (20 mg total) by mouth daily. 90 capsule 1  . ondansetron (ZOFRAN) 4 MG tablet Take 1 tablet (4 mg total) by mouth every 8 (eight) hours as needed. 20 tablet 0  . SUMAtriptan (IMITREX) 25 MG tablet Take 50 mg by mouth every 2 (two) hours as needed for migraine or headache. May repeat in 2 hours if headache persists or recurs.    . topiramate (TOPAMAX) 50 MG tablet Take 1 tablet (50 mg total) by mouth 2 (two) times daily. PATIENT NEEDS OFFICE VISIT FOR ADDITIONAL REFILLS 10 tablet 0  . podofilox (CONDYLOX) 0.5 % gel Apply topically 2 (two) times daily. 3.5 g 0   No current facility-administered medications for this visit.    No Known Allergies  Family History  Problem Relation Age of Onset  . Diabetes Mother   . Arthritis Mother   . Hyperlipidemia Mother   . Hypertension Mother   . Mental illness Mother   . Alcohol abuse Father   . Drug abuse Father   . Cancer Father     colon  . Hyperlipidemia Father   . Arthritis Maternal Grandmother   . Hyperlipidemia Maternal Grandmother   . Arthritis Maternal Grandfather   . Hyperlipidemia  Maternal Grandfather   . Arthritis Paternal Grandmother   . Hyperlipidemia Paternal Grandmother   . Hypertension Paternal Grandmother   . Arthritis Paternal Grandfather   . Cancer Paternal Grandfather     colon and prostate  . Hyperlipidemia Paternal Grandfather   . Diabetes Paternal Grandfather     History   Social History  . Marital Status: Single    Spouse Name: N/A  . Number of Children: N/A  . Years of Education: N/A   Occupational History  . Not on file.   Social History Main Topics  . Smoking status: Never Smoker   . Smokeless tobacco: Never Used  . Alcohol Use: 0.0 oz/week    0 Standard drinks or equivalent per week     Comment: social  . Drug Use: No  . Sexual Activity: Yes   Other Topics Concern  . Not on file   Social History Narrative     Constitutional: Denies fever, malaise, fatigue, headache or abrupt weight changes. Marland Kitchen Respiratory: Denies difficulty breathing, shortness of breath, cough or sputum production.   Cardiovascular: Denies chest pain, chest tightness, palpitations or swelling in the hands or feet.  Gastrointestinal: Denies abdominal pain, bloating, constipation, diarrhea or blood in  the stool.  GU: Denies urgency, frequency, pain with urination, burning sensation, blood in urine, odor or discharge..  Skin: Pt reports rash around anus. Denies redness or ulcercations.    No other specific complaints in a complete review of systems (except as listed in HPI above).  Objective:   Physical Exam   BP 126/90 mmHg  Pulse 70  Temp(Src) 98.6 F (37 C) (Oral)  Wt 309 lb 8 oz (140.388 kg)  SpO2 97% Wt Readings from Last 3 Encounters:  07/14/14 309 lb 8 oz (140.388 kg)  04/02/14 288 lb 4 oz (130.749 kg)  02/13/14 284 lb (128.822 kg)    General: Appears his stated age, obese in NAD. Skin: Warm, dry and intact. Small punctate lesions with white head noted scattered around anus.  Cardiovascular: Normal rate and rhythm. S1,S2 noted.     Pulmonary/Chest: Normal effort and positive vesicular breath sounds. No respiratory distress. No wheezes, rales or ronchi noted.  Neurological: Alert and oriented.   BMET    Component Value Date/Time   NA 139 02/06/2014 1033   K 4.5 02/06/2014 1033   CL 104 02/06/2014 1033   CO2 28 02/06/2014 1033   GLUCOSE 99 02/06/2014 1033   BUN 17 02/06/2014 1033   CREATININE 1.01 02/06/2014 1033   CREATININE 1.11 04/12/2011 1922   CALCIUM 9.9 02/06/2014 1033   GFRNONAA >90 10/04/2013 1150   GFRAA >90 10/04/2013 1150    Lipid Panel  No results found for: CHOL, TRIG, HDL, CHOLHDL, VLDL, LDLCALC  CBC    Component Value Date/Time   WBC 7.1 02/06/2014 1033   WBC 7.2 04/12/2011 1931   RBC 5.75 02/06/2014 1033   RBC 6.10 04/12/2011 1931   HGB 15.2 02/06/2014 1033   HGB 15.9 04/12/2011 1931   HCT 46.5 02/06/2014 1033   HCT 49.0 04/12/2011 1931   PLT 250.0 02/06/2014 1033   MCV 80.8 02/06/2014 1033   MCV 80.4 04/12/2011 1931   MCH 26.6 10/04/2013 1150   MCH 26.1* 04/12/2011 1931   MCHC 32.7 02/06/2014 1033   MCHC 32.4 04/12/2011 1931   RDW 13.6 02/06/2014 1033   LYMPHSABS 1.7 10/04/2013 1150   MONOABS 1.4* 10/04/2013 1150   EOSABS 0.1 10/04/2013 1150   BASOSABS 0.0 10/04/2013 1150    Hgb A1C Lab Results  Component Value Date   HGBA1C 5.6 02/06/2014        Assessment & Plan:   Rash around anus:  Appears to be molluscum eRx for Condolyx gel BID If no improvement in 1-2 weeks, will refer to dermatology  RTC as needed or if symptoms persist or worsen

## 2014-07-14 NOTE — Progress Notes (Signed)
Pre visit review using our clinic review tool, if applicable. No additional management support is needed unless otherwise documented below in the visit note. 

## 2014-07-14 NOTE — Telephone Encounter (Signed)
There is 3 different strength of medication, please send in the Rx you would like pt to have

## 2014-07-14 NOTE — Telephone Encounter (Signed)
RX sent to pharmacy  

## 2014-07-14 NOTE — Telephone Encounter (Signed)
Yes we can switch to imiquimod

## 2014-07-14 NOTE — Telephone Encounter (Signed)
Pt left v/m that podofilox gel is too expensive; cost to pt $600.00. Pt request less expensive med. Spoke with Tobi Bastos at [facility] and she said if imiquimod would be appropriate would be less expensive because it is a generic. Please advise. Pt request cb.

## 2014-07-18 ENCOUNTER — Emergency Department (HOSPITAL_BASED_OUTPATIENT_CLINIC_OR_DEPARTMENT_OTHER)
Admission: EM | Admit: 2014-07-18 | Discharge: 2014-07-19 | Disposition: A | Discharge status: Home / Self Care | Payer: 59 | Attending: Emergency Medicine | Admitting: Emergency Medicine

## 2014-07-18 ENCOUNTER — Encounter (HOSPITAL_BASED_OUTPATIENT_CLINIC_OR_DEPARTMENT_OTHER): Payer: Self-pay | Admitting: *Deleted

## 2014-07-18 DIAGNOSIS — K629 Disease of anus and rectum, unspecified: Secondary | ICD-10-CM

## 2014-07-18 DIAGNOSIS — Z8619 Personal history of other infectious and parasitic diseases: Secondary | ICD-10-CM | POA: Diagnosis not present

## 2014-07-18 DIAGNOSIS — J45909 Unspecified asthma, uncomplicated: Secondary | ICD-10-CM | POA: Diagnosis not present

## 2014-07-18 DIAGNOSIS — Z79899 Other long term (current) drug therapy: Secondary | ICD-10-CM | POA: Diagnosis not present

## 2014-07-18 DIAGNOSIS — K6289 Other specified diseases of anus and rectum: Secondary | ICD-10-CM | POA: Insufficient documentation

## 2014-07-18 DIAGNOSIS — I10 Essential (primary) hypertension: Secondary | ICD-10-CM | POA: Diagnosis not present

## 2014-07-18 DIAGNOSIS — M1990 Unspecified osteoarthritis, unspecified site: Secondary | ICD-10-CM | POA: Diagnosis not present

## 2014-07-18 DIAGNOSIS — F329 Major depressive disorder, single episode, unspecified: Secondary | ICD-10-CM | POA: Insufficient documentation

## 2014-07-18 HISTORY — DX: Anogenital (venereal) warts: A63.0

## 2014-07-18 HISTORY — DX: Crohn's disease, unspecified, without complications: K50.90

## 2014-07-18 NOTE — ED Notes (Addendum)
States he has "bumps" inside his anus that are getting bigger over a weeks time, painful and he has a rectal discharge. He saw his MD 3 days ago and she had no idea what it could be. He has a fever and sweats at night.

## 2014-07-18 NOTE — ED Notes (Signed)
Pt. Reports he has had a mucus white discharge that started today.  Protection use with sexual partner.  Last sexual encounter was approx. 2 wks ago.

## 2014-07-19 ENCOUNTER — Encounter (HOSPITAL_BASED_OUTPATIENT_CLINIC_OR_DEPARTMENT_OTHER): Payer: Self-pay | Admitting: Emergency Medicine

## 2014-07-19 ENCOUNTER — Telehealth: Payer: Self-pay | Admitting: *Deleted

## 2014-07-19 MED ORDER — LIDOCAINE (ANORECTAL) 5 % EX CREA: TOPICAL_CREAM | CUTANEOUS | Status: DC

## 2014-07-19 NOTE — ED Notes (Signed)
Contacted by [facility], Lidocaine 5% cream not available but does have Lidocaine 5% ointment.  Ok to change to ointment provided to pharmacy.

## 2014-07-19 NOTE — ED Provider Notes (Addendum)
CSN: [id number]     Arrival date & time 07/18/14  2251 History   First MD Initiated Contact with Patient 07/19/14 0023     Chief Complaint  Patient presents with  . Rectal Pain     (Consider location/radiation/quality/duration/timing/severity/associated sxs/prior Treatment) HPI  This is a 24 year old male homosexual with a one-week history of painful lesions in his perianal region. He initially thought these were "hair bumps" but they have become more prominent and moderately painful. The pain is exacerbated by bowel movements or wiping. He denies any systemic symptoms such as abdominal pain, fever, chills, chest pain, cough, shortness of breath, nausea, vomiting or diarrhea. This is not like his usual Crohn's exacerbations.  Past Medical History  Diagnosis Date  . Asthma   . Arthritis   . Depression   . Hypertension   . History of chicken pox   . Frequent headaches    Past Surgical History  Procedure Laterality Date  . Meniscus repair  2012   Family History  Problem Relation Age of Onset  . Diabetes Mother   . Arthritis Mother   . Hyperlipidemia Mother   . Hypertension Mother   . Mental illness Mother   . Alcohol abuse Father   . Drug abuse Father   . Cancer Father     colon  . Hyperlipidemia Father   . Arthritis Maternal Grandmother   . Hyperlipidemia Maternal Grandmother   . Arthritis Maternal Grandfather   . Hyperlipidemia Maternal Grandfather   . Arthritis Paternal Grandmother   . Hyperlipidemia Paternal Grandmother   . Hypertension Paternal Grandmother   . Arthritis Paternal Grandfather   . Cancer Paternal Grandfather     colon and prostate  . Hyperlipidemia Paternal Grandfather   . Diabetes Paternal Grandfather    History  Substance Use Topics  . Smoking status: Never Smoker   . Smokeless tobacco: Never Used  . Alcohol Use: 0.0 oz/week    0 Standard drinks or equivalent per week     Comment: social    Review of Systems  All other systems reviewed and  are negative.   Allergies  Review of patient's allergies indicates no known allergies.  Home Medications   Prior to Admission medications   Medication Sig Start Date End Date Taking? Authorizing Provider  divalproex (DEPAKOTE ER) 500 MG 24 hr tablet Take 500 mg by mouth 3 (three) times daily.    Historical Provider, MD  Imiquimod 2.5 % CREA Apply 1 application topically daily. 07/14/14   Lorre Munroe, NP  omeprazole (PRILOSEC) 20 MG capsule Take 1 capsule (20 mg total) by mouth daily. 05/28/14   Lorre Munroe, NP  ondansetron (ZOFRAN) 4 MG tablet Take 1 tablet (4 mg total) by mouth every 8 (eight) hours as needed. 02/10/14   Lorre Munroe, NP  podofilox (CONDYLOX) 0.5 % gel Apply topically 2 (two) times daily. 07/14/14   Lorre Munroe, NP  SUMAtriptan (IMITREX) 25 MG tablet Take 50 mg by mouth every 2 (two) hours as needed for migraine or headache. May repeat in 2 hours if headache persists or recurs.    Historical Provider, MD  topiramate (TOPAMAX) 50 MG tablet Take 1 tablet (50 mg total) by mouth 2 (two) times daily. PATIENT NEEDS OFFICE VISIT FOR ADDITIONAL REFILLS 02/13/14   Elvina Sidle, MD   BP 142/92 mmHg  Pulse 106  Temp(Src) 99.1 F (37.3 C) (Oral)  Resp 18  Ht  (1.803 m)  Wt 269 lb (122.018 kg)  BMI 37.53 kg/m2  SpO2 98%   Physical Exam  General: Well-developed, well-nourished male in no acute distress; appearance consistent with age of record HENT: normocephalic; atraumatic Eyes: pupils equal, round and reactive to light; extraocular muscles intact Neck: supple Heart: regular rate and rhythm Lungs: clear to auscultation bilaterally Abdomen: soft; nondistended; nontender; no masses or hepatosplenomegaly; bowel sounds present Rectal: Normal sphincter tone; no hemorrhoids or fissures seen; hypopigmented, slightly erythematous, perianal skin; multiple small pustular lesions of the perianal skin; a single condyloma is seen at the anal margin Extremities: No  deformity; full range of motion; pulses normal Neurologic: Awake, alert and oriented; motor function intact in all extremities and symmetric; no facial droop Skin: Warm and dry Psychiatric: Normal mood and affect    ED Course  Procedures (including critical care time)   MDM  The hypopigmented perianal skin could represent lichen sclerosis et atrophicus. 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 The lesions may represent herpes simplex although herpes is not usually pustular. Swabs were taken for herpes, gonorrhea and chlamydia. We will also test him for syphilis and HIV. Although he does have Crohn's disease these lesions did not have the appearance of usual perianal Crohn's involvement.  We will provide topical anesthesia for comfort and he will follow-up with his primary care physician Dr. Sampson SiBaity.    Paula LibraJohn Michaelene Dutan, MD 07/19/14 [id number]  Paula LibraJohn Hinton Luellen, MD 07/19/14 [phone]

## 2014-07-20 LAB — HIV ANTIBODY (ROUTINE TESTING W REFLEX): HIV SCREEN 4TH GENERATION: NONREACTIVE

## 2014-07-20 LAB — RPR: RPR Ser Ql: NONREACTIVE

## 2014-07-21 LAB — HERPES SIMPLEX VIRUS CULTURE: Culture: NOT DETECTED

## 2014-07-22 ENCOUNTER — Telehealth: Payer: Self-pay

## 2014-07-22 LAB — GC/CHLAMYDIA PROBE AMP (~~LOC~~) NOT AT ARMC
CHLAMYDIA, DNA PROBE: NEGATIVE
Neisseria Gonorrhea: NEGATIVE

## 2014-07-22 NOTE — Telephone Encounter (Signed)
These labs were drawn at the hospital. All of his labs were normal.

## 2014-07-22 NOTE — Telephone Encounter (Signed)
Pt left v/m requesting cb when all lab results are available from 07/19/14.Please advise.

## 2014-07-23 ENCOUNTER — Telehealth: Payer: Self-pay | Admitting: Internal Medicine

## 2014-07-23 ENCOUNTER — Other Ambulatory Visit: Payer: Self-pay | Admitting: Internal Medicine

## 2014-07-23 DIAGNOSIS — K629 Disease of anus and rectum, unspecified: Secondary | ICD-10-CM

## 2014-07-23 NOTE — Telephone Encounter (Signed)
Spoke with patient and advised results, pt requesting referral to dermatology.

## 2014-07-23 NOTE — Telephone Encounter (Signed)
There is already a result note about this that has been sent to [facility]

## 2014-07-23 NOTE — Telephone Encounter (Signed)
Pt requesting cb regarding labs and blood work. Please call home number listed, thanks

## 2014-07-23 NOTE — Telephone Encounter (Signed)
This has been adressed 

## 2014-07-23 NOTE — Telephone Encounter (Signed)
See previous phone note.  

## 2014-07-23 NOTE — Telephone Encounter (Signed)
Referral placed.

## 2014-07-23 NOTE — Telephone Encounter (Signed)
Pt is inquiring about recent labs and is requesting a call back

## 2014-07-25 ENCOUNTER — Other Ambulatory Visit: Payer: Self-pay | Admitting: Physician Assistant

## 2014-08-17 ENCOUNTER — Other Ambulatory Visit: Payer: Self-pay | Admitting: Internal Medicine

## 2014-08-18 NOTE — Telephone Encounter (Signed)
It does not seem this medication has ever been filled by you--please advise

## 2014-09-01 ENCOUNTER — Telehealth: Payer: Self-pay

## 2014-09-01 NOTE — Telephone Encounter (Signed)
Pt left v/m requesting different med for migraines.Migraines are worsening and are happening more often. Pt presently taking sumatriptan 100 mg without relief. Pt request cb. [facility] [address].pt seen 04/02/14 as f/u appt.

## 2014-09-01 NOTE — Telephone Encounter (Signed)
He needs to make an OV to discuss

## 2014-09-02 NOTE — Telephone Encounter (Signed)
Pt has an appt for tomorrow to f/u

## 2014-09-03 ENCOUNTER — Encounter: Payer: Self-pay | Admitting: Internal Medicine

## 2014-09-03 ENCOUNTER — Ambulatory Visit (INDEPENDENT_AMBULATORY_CARE_PROVIDER_SITE_OTHER): Payer: 59 | Admitting: Internal Medicine

## 2014-09-03 VITALS — BP 128/88 | HR 88 | Temp 99.0°F | Wt 311.0 lb

## 2014-09-03 DIAGNOSIS — F3160 Bipolar disorder, current episode mixed, unspecified: Secondary | ICD-10-CM

## 2014-09-03 DIAGNOSIS — G43011 Migraine without aura, intractable, with status migrainosus: Secondary | ICD-10-CM | POA: Diagnosis not present

## 2014-09-03 MED ORDER — TOPIRAMATE 50 MG PO TABS: 50.0000 mg | ORAL_TABLET | Freq: Every day | ORAL | Status: DC

## 2014-09-03 NOTE — Patient Instructions (Signed)

## 2014-09-03 NOTE — Progress Notes (Signed)
Pre visit review using our clinic review tool, if applicable. No additional management support is needed unless otherwise documented below in the visit note. 

## 2014-09-03 NOTE — Progress Notes (Signed)
Subjective:    Patient ID: Zachary Sanford, male    DOB: [birth year], 24 y.o.   MRN: [id number]  HPI  Pt presents today with c/o worsening headaches. The pain is 7/10, and he reports that it was a slow onset. 3 weeks ago, his headaches began to occur more frequently and feel more intense, but feel the same in character as previous headahce. He has been waking up every day for the past 3 weeks with the headache.They have been 8.5/10, in the front of his head/forehead, tight squeezing and pounding. They last all day. He is sensitive to light and noise but not smells. Taking a nap sometimes helps; but sometimes the HA returns. He has no visual/auditory/gustatory aura. He has not fallen or had LOC. He used to get relief from Sumatriptan 50 mg but the past 3 weeks it has stopped working. One week ago he started taking 100 mg of Sumatriptan, with no relief. He has also tried OTC Advil Migraine and Excedrin Migraine with no relief. He has not missed work at [employer] d/t the pain, but he does sit and rest when he can. He had 1 episode of dizziness but is unsure if it is related. He has been to the eye doctor in the past year and everything was normal. He wears contacts and glasses.  In October 2015, he went to urgent care for a headache and was given a shot (unsure what medication) that worked really well and 10 days of Topamax. He is not currently taking Topamax.  He was on Depakote for bipolar disorder (rx by psychiatrist) but stopped taking it because he did not like how it made him feel. He would like a referral to a new psychiatrist because his current psychiatrist has no appointments for the next 4 months.   Review of Systems  Past Medical History  Diagnosis Date  . Asthma   . Arthritis   . Depression   . Hypertension   . History of chicken pox   . Frequent headaches   . Condylomata acuminata   . Crohn disease    Family History  Problem Relation Age of Onset  . Diabetes Mother   . Arthritis  Mother   . Hyperlipidemia Mother   . Hypertension Mother   . Mental illness Mother   . Alcohol abuse Father   . Drug abuse Father   . Cancer Father     colon  . Hyperlipidemia Father   . Arthritis Maternal Grandmother   . Hyperlipidemia Maternal Grandmother   . Arthritis Maternal Grandfather   . Hyperlipidemia Maternal Grandfather   . Arthritis Paternal Grandmother   . Hyperlipidemia Paternal Grandmother   . Hypertension Paternal Grandmother   . Arthritis Paternal Grandfather   . Cancer Paternal Grandfather     colon and prostate  . Hyperlipidemia Paternal Grandfather   . Diabetes Paternal Grandfather    Social History  Substance Use Topics  . Smoking status: Never Smoker   . Smokeless tobacco: Never Used  . Alcohol Use: 0.0 oz/week    0 Standard drinks or equivalent per week     Comment: social   Current Outpatient Prescriptions on File Prior to Visit  Medication Sig Dispense Refill  . omeprazole (PRILOSEC) 20 MG capsule Take 1 capsule (20 mg total) by mouth daily. 90 capsule 1  . ondansetron (ZOFRAN) 4 MG tablet Take 1 tablet (4 mg total) by mouth every 8 (eight) hours as needed. 20 tablet 0  . podofilox (CONDYLOX) 0.5 %  gel Apply topically 2 (two) times daily. 3.5 g 0  . SUMAtriptan (IMITREX) 25 MG tablet Take 50 mg by mouth every 2 (two) hours as needed for migraine or headache. May repeat in 2 hours if headache persists or recurs.    . SUMAtriptan (IMITREX) 50 MG tablet TAKE 1 TABLET FOR SEVERE HEADACHE, CAN REPEAT IN 2 HOURS IF NEEDED 5 tablet 0  . divalproex (DEPAKOTE ER) 500 MG 24 hr tablet Take 500 mg by mouth 3 (three) times daily.     No current facility-administered medications on file prior to visit.    Gen: Reports headache  Eyes: Denies tearing, vision changes, blurred vision, seeing spots Ears: Denies changes in hearing and loss of balance Nose: Denies unusual smells before headache Throat: Reports waking up with "[place]"; reports snoring Heart:  Denies chest pain, chest tightness, palpitations, tachycardia Lungs: Denies SOB or dyspnea Abd: Denies nausea and vomiting Neuro: Reports 1 episode of dizziness; no falls or LOC    Objective:   Physical Exam  BP 128/88 mmHg  Pulse 88  Temp(Src) 99 F (37.2 C) (Oral)  Wt 311 lb (141.069 kg)  SpO2 99%  Gen: Well-developed, well-nourished, obese African-American male, pleasant, cooperative, soft-spoken Eyes: Horizontal nystagmus; PERRL; EOM's intact; conjunctiva clear; sclera white; no icterus Ears: TM visible and gray; light reflex intact Nose: No erythema; moist turbinates Throat: No erythema; large tonsils Neck: Supple, no lymphadenopathy Heart: RRR; no m/r/g Lungs: Normal respiratory rate and effort; CTA bilaterally; no wheezes, rales, or rhonchi Neuro: Cranial nerves grossly intact      Assessment & Plan:   Worsening headaches (migraine vs. tension headache?)  He has a hx of migraines, though these headaches sound more like a tension headache. Start prophylactic Topamax.  Continue using Sumatriptan 50 mg for abortive treatment.   Bipolar disorder  He stopped taking Depakote because he did not like how it made him feel. His current psychiatrist has a very full schedule. Referral to new psychiatrist today.

## 2014-09-09 ENCOUNTER — Encounter (HOSPITAL_BASED_OUTPATIENT_CLINIC_OR_DEPARTMENT_OTHER): Payer: Self-pay

## 2014-09-09 ENCOUNTER — Emergency Department (HOSPITAL_BASED_OUTPATIENT_CLINIC_OR_DEPARTMENT_OTHER)
Admission: EM | Admit: 2014-09-09 | Discharge: 2014-09-09 | Disposition: A | Discharge status: Home / Self Care | Payer: 59 | Attending: Emergency Medicine | Admitting: Emergency Medicine

## 2014-09-09 ENCOUNTER — Emergency Department (HOSPITAL_BASED_OUTPATIENT_CLINIC_OR_DEPARTMENT_OTHER): Payer: 59

## 2014-09-09 DIAGNOSIS — R52 Pain, unspecified: Secondary | ICD-10-CM | POA: Diagnosis present

## 2014-09-09 DIAGNOSIS — Z8719 Personal history of other diseases of the digestive system: Secondary | ICD-10-CM | POA: Insufficient documentation

## 2014-09-09 DIAGNOSIS — J45909 Unspecified asthma, uncomplicated: Secondary | ICD-10-CM | POA: Diagnosis not present

## 2014-09-09 DIAGNOSIS — B349 Viral infection, unspecified: Secondary | ICD-10-CM | POA: Insufficient documentation

## 2014-09-09 DIAGNOSIS — Z8739 Personal history of other diseases of the musculoskeletal system and connective tissue: Secondary | ICD-10-CM | POA: Diagnosis not present

## 2014-09-09 DIAGNOSIS — Z79899 Other long term (current) drug therapy: Secondary | ICD-10-CM | POA: Insufficient documentation

## 2014-09-09 DIAGNOSIS — I10 Essential (primary) hypertension: Secondary | ICD-10-CM | POA: Insufficient documentation

## 2014-09-09 DIAGNOSIS — F329 Major depressive disorder, single episode, unspecified: Secondary | ICD-10-CM | POA: Diagnosis not present

## 2014-09-09 DIAGNOSIS — R509 Fever, unspecified: Secondary | ICD-10-CM

## 2014-09-09 LAB — RAPID STREP SCREEN (MED CTR MEBANE ONLY): Streptococcus, Group A Screen (Direct): NEGATIVE

## 2014-09-09 MED ORDER — PROMETHAZINE HCL 25 MG PO TABS: 25.0000 mg | ORAL_TABLET | Freq: Four times a day (QID) | ORAL | Status: DC | PRN

## 2014-09-09 MED ORDER — IBUPROFEN 200 MG PO TABS: 200.0000 mg | ORAL_TABLET | Freq: Four times a day (QID) | ORAL | Status: DC | PRN

## 2014-09-09 MED ORDER — PREDNISONE 50 MG PO TABS
60.0000 mg | ORAL_TABLET | Freq: Once | ORAL | Status: AC
  Administered 2014-09-09: 60 mg via ORAL
  Filled 2014-09-09 (×2): qty 1

## 2014-09-09 MED ORDER — HYDROCODONE-HOMATROPINE 5-1.5 MG/5ML PO SYRP: 5.0000 mL | ORAL_SOLUTION | Freq: Four times a day (QID) | ORAL | Status: DC | PRN

## 2014-09-09 NOTE — ED Notes (Signed)
Patient transported to X-ray 

## 2014-09-09 NOTE — ED Notes (Signed)
Pt reports 2 day history of body aches, chills, fatigue, sore throat, fever.

## 2014-09-09 NOTE — Discharge Instructions (Signed)
Cough, Adult ° A cough is a reflex that helps clear your throat and airways. It can help heal the body or may be a reaction to an irritated airway. A cough may only last 2 or 3 weeks (acute) or may last more than 8 weeks (chronic).  °CAUSES °Acute cough: °· Viral or bacterial infections. °Chronic cough: °· Infections. °· Allergies. °· Asthma. °· Post-nasal drip. °· Smoking. °· Heartburn or acid reflux. °· Some medicines. °· Chronic lung problems (COPD). °· Cancer. °SYMPTOMS  °· Cough. °· Fever. °· Chest pain. °· Increased breathing rate. °· High-pitched whistling sound when breathing (wheezing). °· Colored mucus that you cough up (sputum). °TREATMENT  °· A bacterial cough may be treated with antibiotic medicine. °· A viral cough must run its course and will not respond to antibiotics. °· Your caregiver may recommend other treatments if you have a chronic cough. °HOME CARE INSTRUCTIONS  °· Only take over-the-counter or prescription medicines for pain, discomfort, or fever as directed by your caregiver. Use cough suppressants only as directed by your caregiver. °· Use a cold steam vaporizer or humidifier in your bedroom or home to help loosen secretions. °· Sleep in a semi-upright position if your cough is worse at night. °· Rest as needed. °· Stop smoking if you smoke. °SEEK IMMEDIATE MEDICAL CARE IF:  °· You have pus in your sputum. °· Your cough starts to worsen. °· You cannot control your cough with suppressants and are losing sleep. °· You begin coughing up blood. °· You have difficulty breathing. °· You develop pain which is getting worse or is uncontrolled with medicine. °· You have a fever. °MAKE SURE YOU:  °· Understand these instructions. °· Will watch your condition. °· Will get help right away if you are not doing well or get worse. °Document Released: 07/02/2010 Document Revised: 03/28/2011 Document Reviewed: 07/02/2010 °Exit[employer]® Patient Information ©2015 [employer]. This information is not intended  to replace advice given to you by your health care provider. Make sure you discuss any questions you have with your health care provider. °Viral Infections °A viral infection can be caused by different types of viruses. Most viral infections are not serious and resolve on their own. However, some infections may cause severe symptoms and may lead to further complications. °SYMPTOMS °Viruses can frequently cause: °· Minor sore throat. °· Aches and pains. °· Headaches. °· Runny nose. °· Different types of rashes. °· Watery eyes. °· Tiredness. °· Cough. °· Loss of appetite. °· Gastrointestinal infections, resulting in nausea, vomiting, and diarrhea. °These symptoms do not respond to antibiotics because the infection is not caused by bacteria. However, you might catch a bacterial infection following the viral infection. This is sometimes called a "superinfection." Symptoms of such a bacterial infection may include: °· Worsening sore throat with pus and difficulty swallowing. °· Swollen neck glands. °· Chills and a high or persistent fever. °· Severe headache. °· Tenderness over the sinuses. °· Persistent overall ill feeling (malaise), muscle aches, and tiredness (fatigue). °· Persistent cough. °· Yellow, green, or brown mucus production with coughing. °HOME CARE INSTRUCTIONS  °· Only take over-the-counter or prescription medicines for pain, discomfort, diarrhea, or fever as directed by your caregiver. °· Drink enough water and fluids to keep your urine clear or pale yellow. Sports drinks can provide valuable electrolytes, sugars, and hydration. °· Get plenty of rest and maintain proper nutrition. Soups and broths with crackers or rice are fine. °SEEK IMMEDIATE MEDICAL CARE IF:  °· You have severe headaches,   shortness of breath, chest pain, neck pain, or an unusual rash. °· You have uncontrolled vomiting, diarrhea, or you are unable to keep down fluids. °· You or your child has an oral temperature above 102° F (38.9° C),  not controlled by medicine. °· Your baby is older than 3 months with a rectal temperature of 102° F (38.9° C) or higher. °· Your baby is 3 months old or younger with a rectal temperature of 100.4° F (38° C) or higher. °MAKE SURE YOU:  °· Understand these instructions. °· Will watch your condition. °· Will get help right away if you are not doing well or get worse. °Document Released: 10/13/2004 Document Revised: 03/28/2011 Document Reviewed: 05/10/2010 °Exit[employer]® Patient Information ©2015 [employer]. This information is not intended to replace advice given to you by your health care provider. Make sure you discuss any questions you have with your health care provider. ° °

## 2014-09-09 NOTE — ED Provider Notes (Signed)
CSN: [id number]     Arrival date & time 09/09/14  2115 History  This chart was scribed for Gilda Crease, MD by Octavia Heir, ED Scribe. This patient was seen in room MH09/MH09 and the patient's care was started at 9:47 PM.    Chief Complaint  Patient presents with  . Generalized Body Aches      The history is provided by the patient. No language interpreter was used.   HPI Comments: Zachary Sanford is a 24 y.o. male who presents to the Emergency Department complaining of sudden onset, gradual worsening generalized body aches onset 2 days ago. Pt notes having associated nausea, dry cough, sore throat, trouble swallowing, chills and fever (tmax 102). Pt has not taken any medication to alleviate the symptoms. Pt denies vomiting and diarrhea.  Past Medical History  Diagnosis Date  . Asthma   . Arthritis   . Depression   . Hypertension   . History of chicken pox   . Frequent headaches   . Condylomata acuminata   . Crohn disease    Past Surgical History  Procedure Laterality Date  . Meniscus repair  2012   Family History  Problem Relation Age of Onset  . Diabetes Mother   . Arthritis Mother   . Hyperlipidemia Mother   . Hypertension Mother   . Mental illness Mother   . Alcohol abuse Father   . Drug abuse Father   . Cancer Father     colon  . Hyperlipidemia Father   . Arthritis Maternal Grandmother   . Hyperlipidemia Maternal Grandmother   . Arthritis Maternal Grandfather   . Hyperlipidemia Maternal Grandfather   . Arthritis Paternal Grandmother   . Hyperlipidemia Paternal Grandmother   . Hypertension Paternal Grandmother   . Arthritis Paternal Grandfather   . Cancer Paternal Grandfather     colon and prostate  . Hyperlipidemia Paternal Grandfather   . Diabetes Paternal Grandfather    Social History  Substance Use Topics  . Smoking status: Never Smoker   . Smokeless tobacco: Never Used  . Alcohol Use: 0.0 oz/week    0 Standard drinks or equivalent per  week     Comment: social    Review of Systems  Constitutional: Positive for fever, chills and fatigue.  HENT: Positive for sore throat and trouble swallowing.   Gastrointestinal: Positive for nausea. Negative for vomiting and diarrhea.  All other systems reviewed and are negative.     Allergies  Review of patient's allergies indicates no known allergies.  Home Medications   Prior to Admission medications   Medication Sig Start Date End Date Taking? Authorizing Provider  ondansetron (ZOFRAN) 4 MG tablet Take 1 tablet (4 mg total) by mouth every 8 (eight) hours as needed. 02/10/14  Yes Lorre Munroe, NP  SUMAtriptan (IMITREX) 25 MG tablet Take 50 mg by mouth every 2 (two) hours as needed for migraine or headache. May repeat in 2 hours if headache persists or recurs.   Yes Historical Provider, MD  SUMAtriptan (IMITREX) 50 MG tablet TAKE 1 TABLET FOR SEVERE HEADACHE, CAN REPEAT IN 2 HOURS IF NEEDED 08/18/14  Yes Lorre Munroe, NP  topiramate (TOPAMAX) 50 MG tablet Take 1 tablet (50 mg total) by mouth daily. PATIENT NEEDS OFFICE VISIT FOR ADDITIONAL REFILLS 09/03/14  Yes Lorre Munroe, NP  divalproex (DEPAKOTE ER) 500 MG 24 hr tablet Take 500 mg by mouth 3 (three) times daily.    Historical Provider, MD  omeprazole (PRILOSEC) 20 MG capsule  Take 1 capsule (20 mg total) by mouth daily. 05/28/14   Lorre Munroe, NP  podofilox (CONDYLOX) 0.5 % gel Apply topically 2 (two) times daily. 07/14/14   Lorre Munroe, NP   Triage vitals: BP 129/90 mmHg  Pulse 110  Temp(Src) 99.4 F (37.4 C) (Oral)  Resp 16  Ht 6' (1.829 m)  Wt 300 lb (136.079 kg)  BMI 40.68 kg/m2  SpO2 100% Physical Exam  Constitutional: He is oriented to person, place, and time. He appears well-developed and well-nourished. No distress.  HENT:  Head: Normocephalic and atraumatic.  Right Ear: Hearing normal.  Left Ear: Hearing normal.  Nose: Nose normal.  Mouth/Throat: Oropharynx is clear and moist and mucous membranes are  normal.  Eyes: Conjunctivae and EOM are normal. Pupils are equal, round, and reactive to light.  Neck: Normal range of motion. Neck supple.  Cardiovascular: Regular rhythm, S1 normal and S2 normal.  Exam reveals no gallop and no friction rub.   No murmur heard. Pulmonary/Chest: Effort normal and breath sounds normal. No respiratory distress. He exhibits no tenderness.  Scattered rhonchi on right base  Abdominal: Soft. Normal appearance and bowel sounds are normal. There is no hepatosplenomegaly. There is no tenderness. There is no rebound, no guarding, no tenderness at McBurney's point and negative Murphy's sign. No hernia.  Musculoskeletal: Normal range of motion.  Neurological: He is alert and oriented to person, place, and time. He has normal strength. No cranial nerve deficit or sensory deficit. Coordination normal. GCS eye subscore is 4. GCS verbal subscore is 5. GCS motor subscore is 6.  Skin: Skin is warm, dry and intact. No rash noted. No cyanosis.  Psychiatric: He has a normal mood and affect. His speech is normal and behavior is normal. Thought content normal.  Nursing note and vitals reviewed.   ED Course  Procedures  DIAGNOSTIC STUDIES: Oxygen Saturation is 100% on RA, normal by my interpretation.  COORDINATION OF CARE:  9:49 PM Discussed treatment plan which includes x-ray with pt at bedside and pt agreed to plan.  Labs Review Labs Reviewed  RAPID STREP SCREEN (NOT AT [facility])    Imaging Review No results found. I have personally reviewed and evaluated these images and lab results as part of my medical decision-making.   EKG Interpretation None      MDM   Final diagnoses:  None   fever Viral syndrome  Patient presents to the emergency department for evaluation of symptoms that appear consistent with a viral syndrome. Patient has been expressing fever, sore throat, cough, congestion, nausea without vomiting. Rapid strep was negative. Patient did have scattered  rhonchi on auscultation of lungs, but chest x-ray was clear. Does have history of Crohn's disease, but does not have primarily abdominal complaints. Abdominal exam also benign. Neck is supple, no headache. No concern for meningitis. Patient reassured, symptoms treatment with ibuprofen and Tylenol for pain and fever. Will give dose of prednisone for pharyngitis. Throat culture pending.  I personally performed the services described in this documentation, which was scribed in my presence. The recorded information has been reviewed and is accurate.    Gilda Crease, MD 09/09/14 2223

## 2014-09-12 LAB — CULTURE, GROUP A STREP: Strep A Culture: NEGATIVE

## 2014-09-29 ENCOUNTER — Other Ambulatory Visit: Payer: Self-pay

## 2014-09-29 DIAGNOSIS — G43011 Migraine without aura, intractable, with status migrainosus: Secondary | ICD-10-CM

## 2014-09-29 MED ORDER — TOPIRAMATE 50 MG PO TABS: 50.0000 mg | ORAL_TABLET | Freq: Every day | ORAL | Status: DC

## 2014-09-29 NOTE — Telephone Encounter (Signed)
Kayla with [facility] left v/m ins is requiring 90 day rx for topiramate.Please advise.pt last seen and rx sent to pharmacy [id number] x 2 on 09/03/14.Please advise.

## 2014-09-29 NOTE — Telephone Encounter (Signed)
Medication sent electronically 

## 2014-10-01 ENCOUNTER — Other Ambulatory Visit: Payer: Self-pay | Admitting: *Deleted

## 2014-12-19 ENCOUNTER — Other Ambulatory Visit: Payer: Self-pay | Admitting: Internal Medicine

## 2014-12-19 NOTE — Telephone Encounter (Signed)
Last filled 08/18/14--please advise

## 2015-02-03 ENCOUNTER — Emergency Department (HOSPITAL_BASED_OUTPATIENT_CLINIC_OR_DEPARTMENT_OTHER): Payer: 59

## 2015-02-03 ENCOUNTER — Emergency Department (HOSPITAL_BASED_OUTPATIENT_CLINIC_OR_DEPARTMENT_OTHER)
Admission: EM | Admit: 2015-02-03 | Discharge: 2015-02-04 | Disposition: A | Discharge status: Home / Self Care | Payer: 59 | Attending: Emergency Medicine | Admitting: Emergency Medicine

## 2015-02-03 ENCOUNTER — Encounter (HOSPITAL_BASED_OUTPATIENT_CLINIC_OR_DEPARTMENT_OTHER): Payer: Self-pay | Admitting: *Deleted

## 2015-02-03 DIAGNOSIS — K625 Hemorrhage of anus and rectum: Secondary | ICD-10-CM | POA: Insufficient documentation

## 2015-02-03 DIAGNOSIS — I10 Essential (primary) hypertension: Secondary | ICD-10-CM | POA: Insufficient documentation

## 2015-02-03 DIAGNOSIS — Z8619 Personal history of other infectious and parasitic diseases: Secondary | ICD-10-CM | POA: Insufficient documentation

## 2015-02-03 DIAGNOSIS — M1990 Unspecified osteoarthritis, unspecified site: Secondary | ICD-10-CM | POA: Insufficient documentation

## 2015-02-03 DIAGNOSIS — J45909 Unspecified asthma, uncomplicated: Secondary | ICD-10-CM | POA: Insufficient documentation

## 2015-02-03 DIAGNOSIS — K602 Anal fissure, unspecified: Secondary | ICD-10-CM | POA: Insufficient documentation

## 2015-02-03 DIAGNOSIS — Z79899 Other long term (current) drug therapy: Secondary | ICD-10-CM | POA: Insufficient documentation

## 2015-02-03 DIAGNOSIS — Z8659 Personal history of other mental and behavioral disorders: Secondary | ICD-10-CM | POA: Insufficient documentation

## 2015-02-03 LAB — CBC WITH DIFFERENTIAL/PLATELET
BASOS ABS: 0 10*3/uL (ref 0.0–0.1)
Basophils Relative: 0 %
EOS PCT: 2 %
Eosinophils Absolute: 0.2 10*3/uL (ref 0.0–0.7)
HCT: 41.2 % (ref 39.0–52.0)
Hemoglobin: 13.3 g/dL (ref 13.0–17.0)
Lymphocytes Relative: 31 %
Lymphs Abs: 3.1 10*3/uL (ref 0.7–4.0)
MCH: 26.1 pg (ref 26.0–34.0)
MCHC: 32.3 g/dL (ref 30.0–36.0)
MCV: 80.8 fL (ref 78.0–100.0)
Monocytes Absolute: 0.9 10*3/uL (ref 0.1–1.0)
Monocytes Relative: 9 %
Neutro Abs: 5.9 10*3/uL (ref 1.7–7.7)
Neutrophils Relative %: 58 %
PLATELETS: 254 10*3/uL (ref 150–400)
RBC: 5.1 MIL/uL (ref 4.22–5.81)
RDW: 13.8 % (ref 11.5–15.5)
WBC: 10.2 10*3/uL (ref 4.0–10.5)

## 2015-02-03 LAB — URINALYSIS, ROUTINE W REFLEX MICROSCOPIC
Bilirubin Urine: NEGATIVE
Glucose, UA: NEGATIVE mg/dL
Hgb urine dipstick: NEGATIVE
KETONES UR: NEGATIVE mg/dL
Leukocytes, UA: NEGATIVE
NITRITE: NEGATIVE
Protein, ur: NEGATIVE mg/dL
Specific Gravity, Urine: 1.025 (ref 1.005–1.030)
pH: 6.5 (ref 5.0–8.0)

## 2015-02-03 LAB — COMPREHENSIVE METABOLIC PANEL
ALK PHOS: 66 U/L (ref 38–126)
ALT: 16 U/L — ABNORMAL LOW (ref 17–63)
ANION GAP: 5 (ref 5–15)
AST: 18 U/L (ref 15–41)
Albumin: 4 g/dL (ref 3.5–5.0)
BUN: 12 mg/dL (ref 6–20)
CO2: 29 mmol/L (ref 22–32)
Calcium: 8.9 mg/dL (ref 8.9–10.3)
Chloride: 108 mmol/L (ref 101–111)
Creatinine, Ser: 0.77 mg/dL (ref 0.61–1.24)
GFR calc non Af Amer: >60 mL/min (ref >60)
Glucose, Bld: 75 mg/dL (ref 65–99)
Potassium: 4 mmol/L (ref 3.5–5.1)
SODIUM: 142 mmol/L (ref 135–145)
Total Bilirubin: 0.3 mg/dL (ref 0.3–1.2)
Total Protein: 7 g/dL (ref 6.5–8.1)

## 2015-02-03 LAB — LIPASE, BLOOD: Lipase: 23 U/L (ref 11–51)

## 2015-02-03 LAB — OCCULT BLOOD X 1 CARD TO LAB, STOOL: FECAL OCCULT BLD: POSITIVE — AB

## 2015-02-03 MED ORDER — DOCUSATE SODIUM 100 MG PO CAPS: 100.0000 mg | ORAL_CAPSULE | Freq: Two times a day (BID) | ORAL | Status: DC

## 2015-02-03 MED ORDER — IOHEXOL 300 MG/ML  SOLN
100.0000 mL | Freq: Once | INTRAMUSCULAR | Status: AC | PRN
  Administered 2015-02-03: 100 mL via INTRAVENOUS

## 2015-02-03 MED ORDER — IOHEXOL 300 MG/ML  SOLN
50.0000 mL | Freq: Once | INTRAMUSCULAR | Status: AC | PRN
Start: 2015-02-03 — End: 2015-02-03
  Administered 2015-02-03: 50 mL via ORAL

## 2015-02-03 MED ORDER — HYDROCORTISONE 2.5 % RE CREA: TOPICAL_CREAM | RECTAL | Status: DC

## 2015-02-03 NOTE — Discharge Instructions (Signed)
Anal Fissure, Adult °An anal fissure is a small tear or crack in the skin around the anus. Bleeding from a fissure usually stops on its own within a few minutes. However, bleeding will often occur again with each bowel movement until the crack heals. °CAUSES °This condition may be caused by: °· Passing large, hard stool (feces). °· Frequent diarrhea. °· Constipation. °· Inflammatory bowel disease (Crohn disease or ulcerative colitis). °· Infections. °· Anal sex. °SYMPTOMS °Symptoms of this condition include: °· Bleeding from the rectum. °· Small amounts of blood seen on your stool, on toilet paper, or in the toilet after a bowel movement. °· Painful bowel movements. °· Itching or irritation around the anus. °DIAGNOSIS  °A health care provider may diagnose this condition by closely examining the anal area. An anal fissure can usually be seen with careful inspection. In some cases, a rectal exam may be performed, or a short tube (anoscope) may be used to examine the anal canal. °TREATMENT °Treatment for this condition may include: °· Taking steps to avoid constipation. This may include making changes to your diet, such as increasing your intake of fiber or fluid. °· Taking fiber supplements. These supplements can soften your stool to help make bowel movements easier. Your health care provider may also prescribe a stool softener if your stool is often hard. °· Taking sitz baths. This may help to heal the tear. °· Using medicated creams or ointments. These may be prescribed to lessen discomfort. °HOME CARE INSTRUCTIONS °Eating and Drinking °· Avoid foods that may be constipating, such as bananas and dairy products. °· Drink enough fluid to keep your urine clear or pale yellow. °· Maintain a diet that is high in fruits, whole grains, and vegetables. °General Instructions °· Keep the anal area as clean and dry as possible. °· Take sitz baths as told by your health care provider. Do not use soap in the sitz baths. °· Take  over-the-counter and prescription medicines only as told by your health care provider. °· Use creams or ointments only as told by your health care provider. °· Keep all follow-up visits as told by your health care provider. This is important. °SEEK MEDICAL CARE IF: °· You have more bleeding. °· You have a fever. °· You have diarrhea that is mixed with blood. °· You continue to have pain. °· Your problem is getting worse rather than better. °  °This information is not intended to replace advice given to you by your health care provider. Make sure you discuss any questions you have with your health care provider. °  °Document Released: 01/03/2005 Document Revised: 09/24/2014 Document Reviewed: 03/31/2014 °Elsevier Interactive Patient Education ©2016 [employer] ° °

## 2015-02-03 NOTE — ED Provider Notes (Signed)
CSN: [id number]     Arrival date & time 02/03/15  1832 History  By signing my name below, I, Octavia Heir, attest that this documentation has been prepared under the direction and in the presence of Glynn Octave, MD. Electronically Signed: Octavia Heir, ED Scribe. 02/03/2015. 7:00 PM.    Chief Complaint  Patient presents with  . Abdominal Pain     The history is provided by the patient. No language interpreter was used.   HPI Comments: Zachary Sanford is a 25 y.o. male who has a hx of HTN and Chrohn's disease presents to the Emergency Department complaining of intermittent, gradual worsening lower abdominal pain with associated rectal pain and increased rectal bleeding. Pt notes having internal hemorrhoids thatt have "fallen down". He reports normally having brown bowel movements and seeing blood in the toilet and when wiping. Pt states recently, he has been having increased rectal bleeding without using the restroom. Pt says he can feel something "leaking" while standing or sitting, go to the restroom and find blood on his undergarments. Pt does not engage in anal sex.  Denies blood thinners, loss of appetite, fever, dysuria, hematuria, testicle pain, dizziness, and light-headedness.   Past Medical History  Diagnosis Date  . Asthma   . Arthritis   . Depression   . Hypertension   . History of chicken pox   . Frequent headaches   . Condylomata acuminata   . Crohn disease [facility])    Past Surgical History  Procedure Laterality Date  . Meniscus repair  2012   Family History  Problem Relation Age of Onset  . Diabetes Mother   . Arthritis Mother   . Hyperlipidemia Mother   . Hypertension Mother   . Mental illness Mother   . Alcohol abuse Father   . Drug abuse Father   . Cancer Father     colon  . Hyperlipidemia Father   . Arthritis Maternal Grandmother   . Hyperlipidemia Maternal Grandmother   . Arthritis Maternal Grandfather   . Hyperlipidemia Maternal Grandfather   .  Arthritis Paternal Grandmother   . Hyperlipidemia Paternal Grandmother   . Hypertension Paternal Grandmother   . Arthritis Paternal Grandfather   . Cancer Paternal Grandfather     colon and prostate  . Hyperlipidemia Paternal Grandfather   . Diabetes Paternal Grandfather    Social History  Substance Use Topics  . Smoking status: Never Smoker   . Smokeless tobacco: Never Used  . Alcohol Use: 0.0 oz/week    0 Standard drinks or equivalent per week     Comment: social    Review of Systems  A complete 10 system review of systems was obtained and all systems are negative except as noted in the HPI and PMH.    Allergies  Review of patient's allergies indicates no known allergies.  Home Medications   Prior to Admission medications   Medication Sig Start Date End Date Taking? Authorizing Provider  divalproex (DEPAKOTE ER) 500 MG 24 hr tablet Take 500 mg by mouth 3 (three) times daily.    Historical Provider, MD  docusate sodium (COLACE) 100 MG capsule Take 1 capsule (100 mg total) by mouth every 12 (twelve) hours. 02/03/15   Glynn Octave, MD  HYDROcodone-homatropine [facility]) 5-1.5 MG/5ML syrup Take 5 mLs by mouth every 6 (six) hours as needed for cough. 09/09/14   Gilda Crease, MD  hydrocortisone (ANUSOL-HC) 2.5 % rectal cream Apply rectally 2 times daily 02/03/15   Glynn Octave, MD  ibuprofen (ADVIL)  200 MG tablet Take 1 tablet (200 mg total) by mouth every 6 (six) hours as needed. 09/09/14   Gilda Crease, MD  omeprazole (PRILOSEC) 20 MG capsule Take 1 capsule (20 mg total) by mouth daily. 05/28/14   Lorre Munroe, NP  ondansetron (ZOFRAN) 4 MG tablet Take 1 tablet (4 mg total) by mouth every 8 (eight) hours as needed. 02/10/14   Lorre Munroe, NP  podofilox (CONDYLOX) 0.5 % gel Apply topically 2 (two) times daily. 07/14/14   Lorre Munroe, NP  promethazine (PHENERGAN) 25 MG tablet Take 1 tablet (25 mg total) by mouth every 6 (six) hours as needed for nausea or  vomiting. 09/09/14   Gilda Crease, MD  SUMAtriptan (IMITREX) 25 MG tablet Take 50 mg by mouth every 2 (two) hours as needed for migraine or headache. May repeat in 2 hours if headache persists or recurs.    Historical Provider, MD  SUMAtriptan (IMITREX) 50 MG tablet Take 1 tablet by mouth for severe headache, can repeat in 2 hours as needed 12/19/14   Lorre Munroe, NP  topiramate (TOPAMAX) 50 MG tablet Take 1 tablet (50 mg total) by mouth daily. PATIENT NEEDS OFFICE VISIT FOR ADDITIONAL REFILLS 09/29/14   Lorre Munroe, NP   Triage vitals: BP 142/92 mmHg  Pulse 95  Temp(Src) 98.3 F (36.8 C) (Oral)  Resp 18  Ht  (1.803 m)  Wt 275 lb (124.739 kg)  BMI 38.37 kg/m2  SpO2 100% Physical Exam  Constitutional: He is oriented to person, place, and time. He appears well-developed and well-nourished. No distress.  HENT:  Head: Normocephalic and atraumatic.  Mouth/Throat: Oropharynx is clear and moist. No oropharyngeal exudate.  Eyes: Conjunctivae and EOM are normal. Pupils are equal, round, and reactive to light.  Neck: Normal range of motion. Neck supple.  No meningismus.  Cardiovascular: Normal rate, regular rhythm, normal heart sounds and intact distal pulses.   No murmur heard. Pulmonary/Chest: Effort normal and breath sounds normal. No respiratory distress.  Abdominal: Soft. There is no tenderness. There is no rebound and no guarding.  Tenderness to upper and lower abdomen  Genitourinary:  No gross blood per rectal exam 3 small anal fissure hemorrhoids at the 12-1 o clock position  Musculoskeletal: Normal range of motion. He exhibits no edema or tenderness.  Neurological: He is alert and oriented to person, place, and time. No cranial nerve deficit. He exhibits normal muscle tone. Coordination normal.  No ataxia on finger to nose bilaterally. No pronator drift. 5/5 strength throughout. CN 2-12 intact.Equal grip strength. Sensation intact.   Skin: Skin is warm.   Psychiatric: He has a normal mood and affect. His behavior is normal.  Nursing note and vitals reviewed.   ED Course  Procedures  DIAGNOSTIC STUDIES: Oxygen Saturation is 100% on RA, normal by my interpretation.  COORDINATION OF CARE:  7:00 PM Discussed treatment plan with pt at bedside and pt agreed to plan.  Labs Review Labs Reviewed  OCCULT BLOOD X 1 CARD TO LAB, STOOL - Abnormal; Notable for the following:    Fecal Occult Bld POSITIVE (*)    All other components within normal limits  COMPREHENSIVE METABOLIC PANEL - Abnormal; Notable for the following:    ALT 16 (*)    All other components within normal limits  URINALYSIS, ROUTINE W REFLEX MICROSCOPIC (NOT AT [facility])  CBC WITH DIFFERENTIAL/PLATELET  LIPASE, BLOOD    Imaging Review Ct Abdomen Pelvis W Contrast  02/03/2015  CLINICAL  DATA:  Acute onset of mid abdominal pain, with bright red blood in stools. Initial encounter. EXAM: CT ABDOMEN AND PELVIS WITH CONTRAST TECHNIQUE: Multidetector CT imaging of the abdomen and pelvis was performed using the standard protocol following bolus administration of intravenous contrast. CONTRAST:  OMNIPAQUE IOHEXOL 300 MG/ML  SOLN COMPARISON:  Abdominal radiograph performed 04/07/2010 FINDINGS: The visualized lung bases are clear. The liver and spleen are unremarkable in appearance. The gallbladder is decompressed and within normal limits. The pancreas and adrenal glands are unremarkable. The kidneys are unremarkable in appearance. There is no evidence of hydronephrosis. No renal or ureteral stones are seen. No perinephric stranding is appreciated. No free fluid is identified. The small bowel is unremarkable in appearance. The stomach is within normal limits. No acute vascular abnormalities are seen. The appendix is filled with contrast and unremarkable in appearance. The colon is unremarkable in appearance. The bladder is mildly distended and grossly unremarkable. The prostate remains normal in  size. No inguinal lymphadenopathy is seen. No acute osseous abnormalities are identified. IMPRESSION: Unremarkable contrast-enhanced CT of the abdomen and pelvis. Electronically Signed   By: Roanna Raider M.D.   On: 02/03/2015 23:13   I have personally reviewed and evaluated these images and lab results as part of my medical decision-making.   EKG Interpretation None      MDM   Final diagnoses:  Anal fissure  Rectal bleeding  Lower abdominal pain and rectal pain for the past 3 days, BRB with wiping. Questionable history of Crohn's disease.  Exam shows anal fissures. No gross blood. Hemoglobin is stable.  Patient will be treated with stool softeners, and anusol cream. Follow-up with PCP and GI. Return precautions discussed. Advised to avoid straining on the toilet.  I personally performed the services described in this documentation, which was scribed in my presence. The recorded information has been reviewed and is accurate.   Glynn Octave, MD 02/04/15 [phone]

## 2015-02-03 NOTE — ED Notes (Signed)
Abdominal pain in the lower part of his abdomen x4 days. Rectal pain. Bright red blood from from his rectum. States he has hemorrhoids.

## 2015-02-03 NOTE — ED Notes (Signed)
Patient is alert and oriented x3.  He was given DC instructions and follow up visit instructions.  Patient gave verbal understanding.  He was DC ambulatory under his own power to home.  V/S stable.  He was not showing any signs of distress on DC 

## 2015-03-01 ENCOUNTER — Other Ambulatory Visit: Payer: Self-pay | Admitting: Internal Medicine

## 2016-01-18 IMAGING — CR DG CHEST 2V
2 series · 2 of 2 positions shown · non-contrast
Comparison: 04/23/2012

CLINICAL DATA: Cough, chest pain, and generalized body aches for 3
days. Fever. Nonsmoker. History of asthma.

EXAM:
CHEST  2 VIEW

[w chest pa]
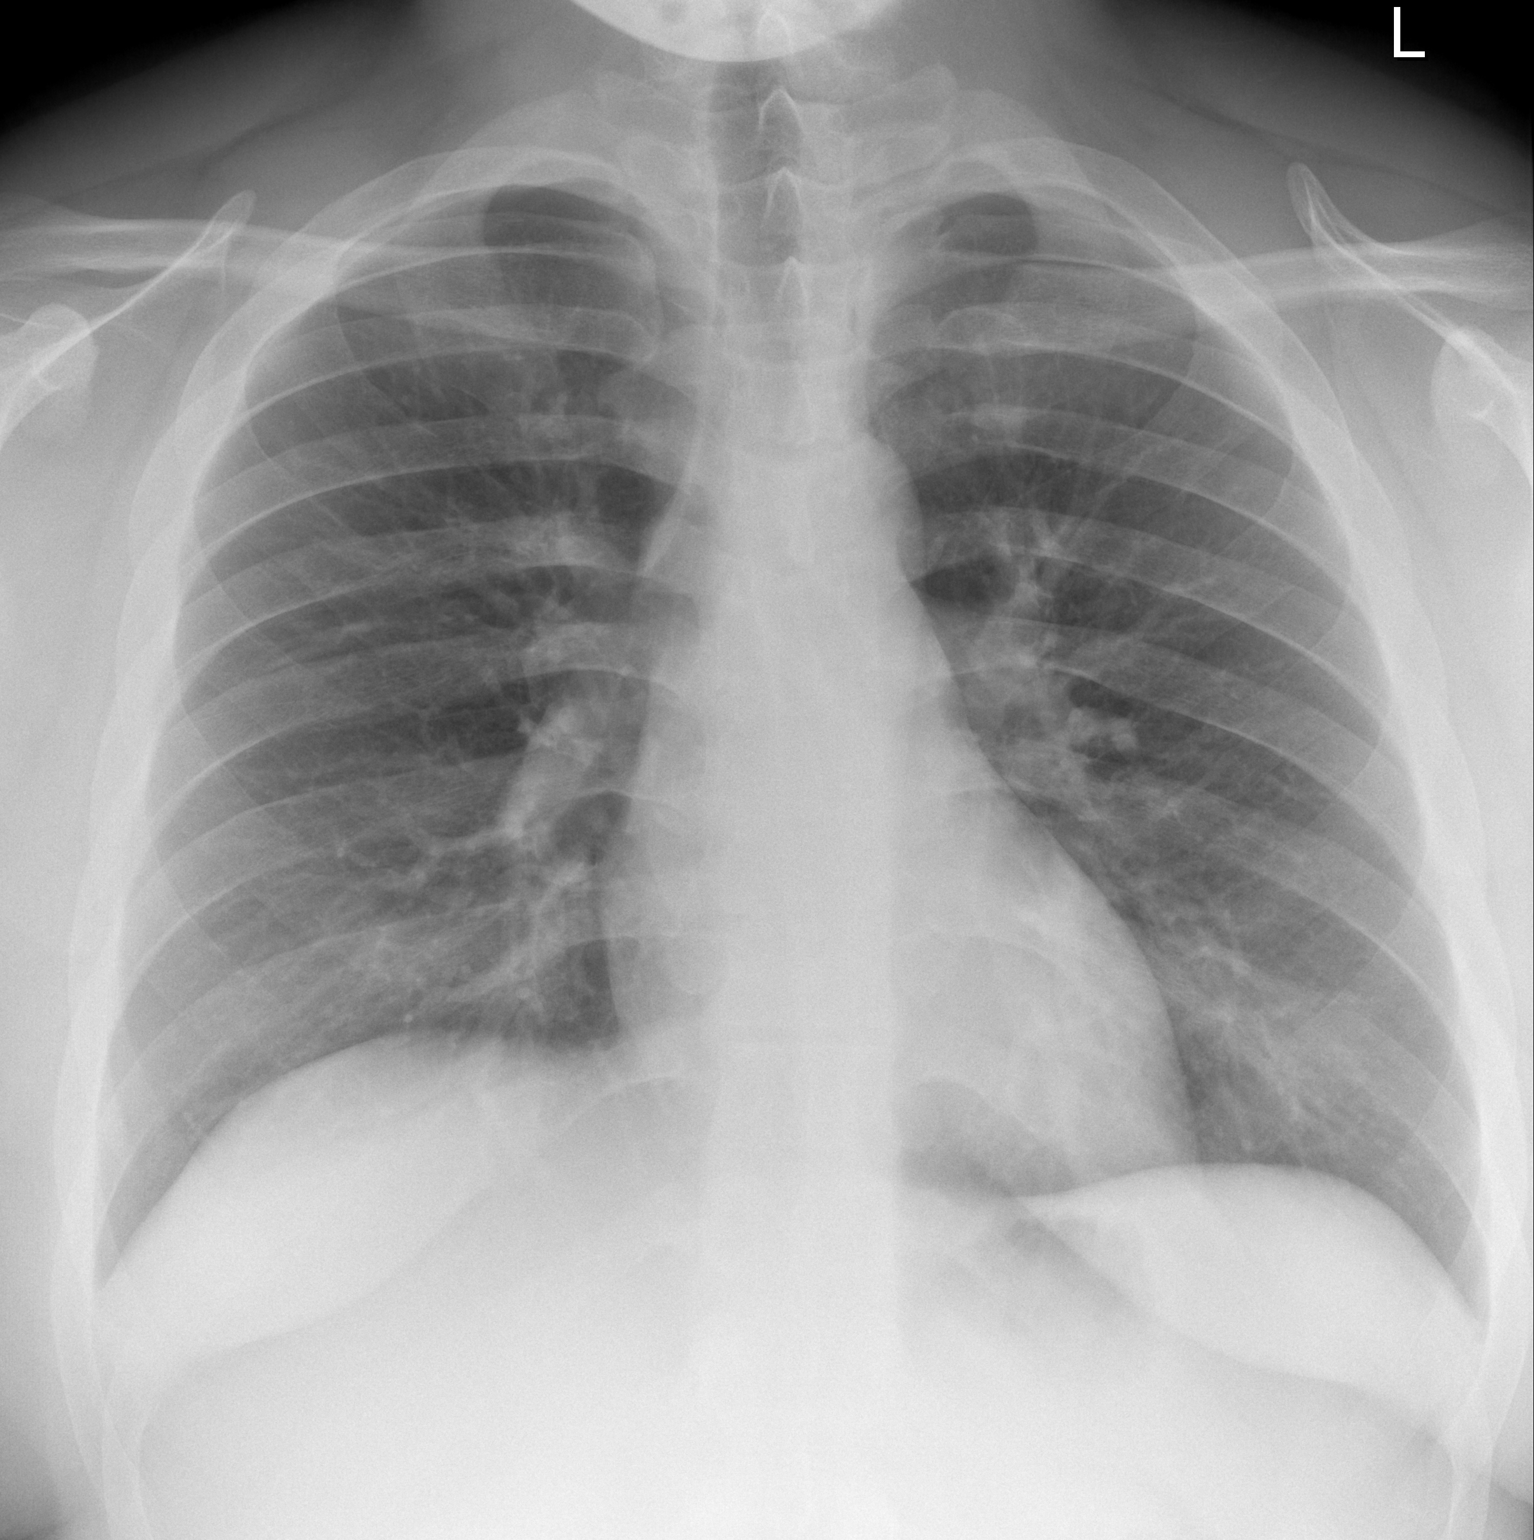

[w chest lat]
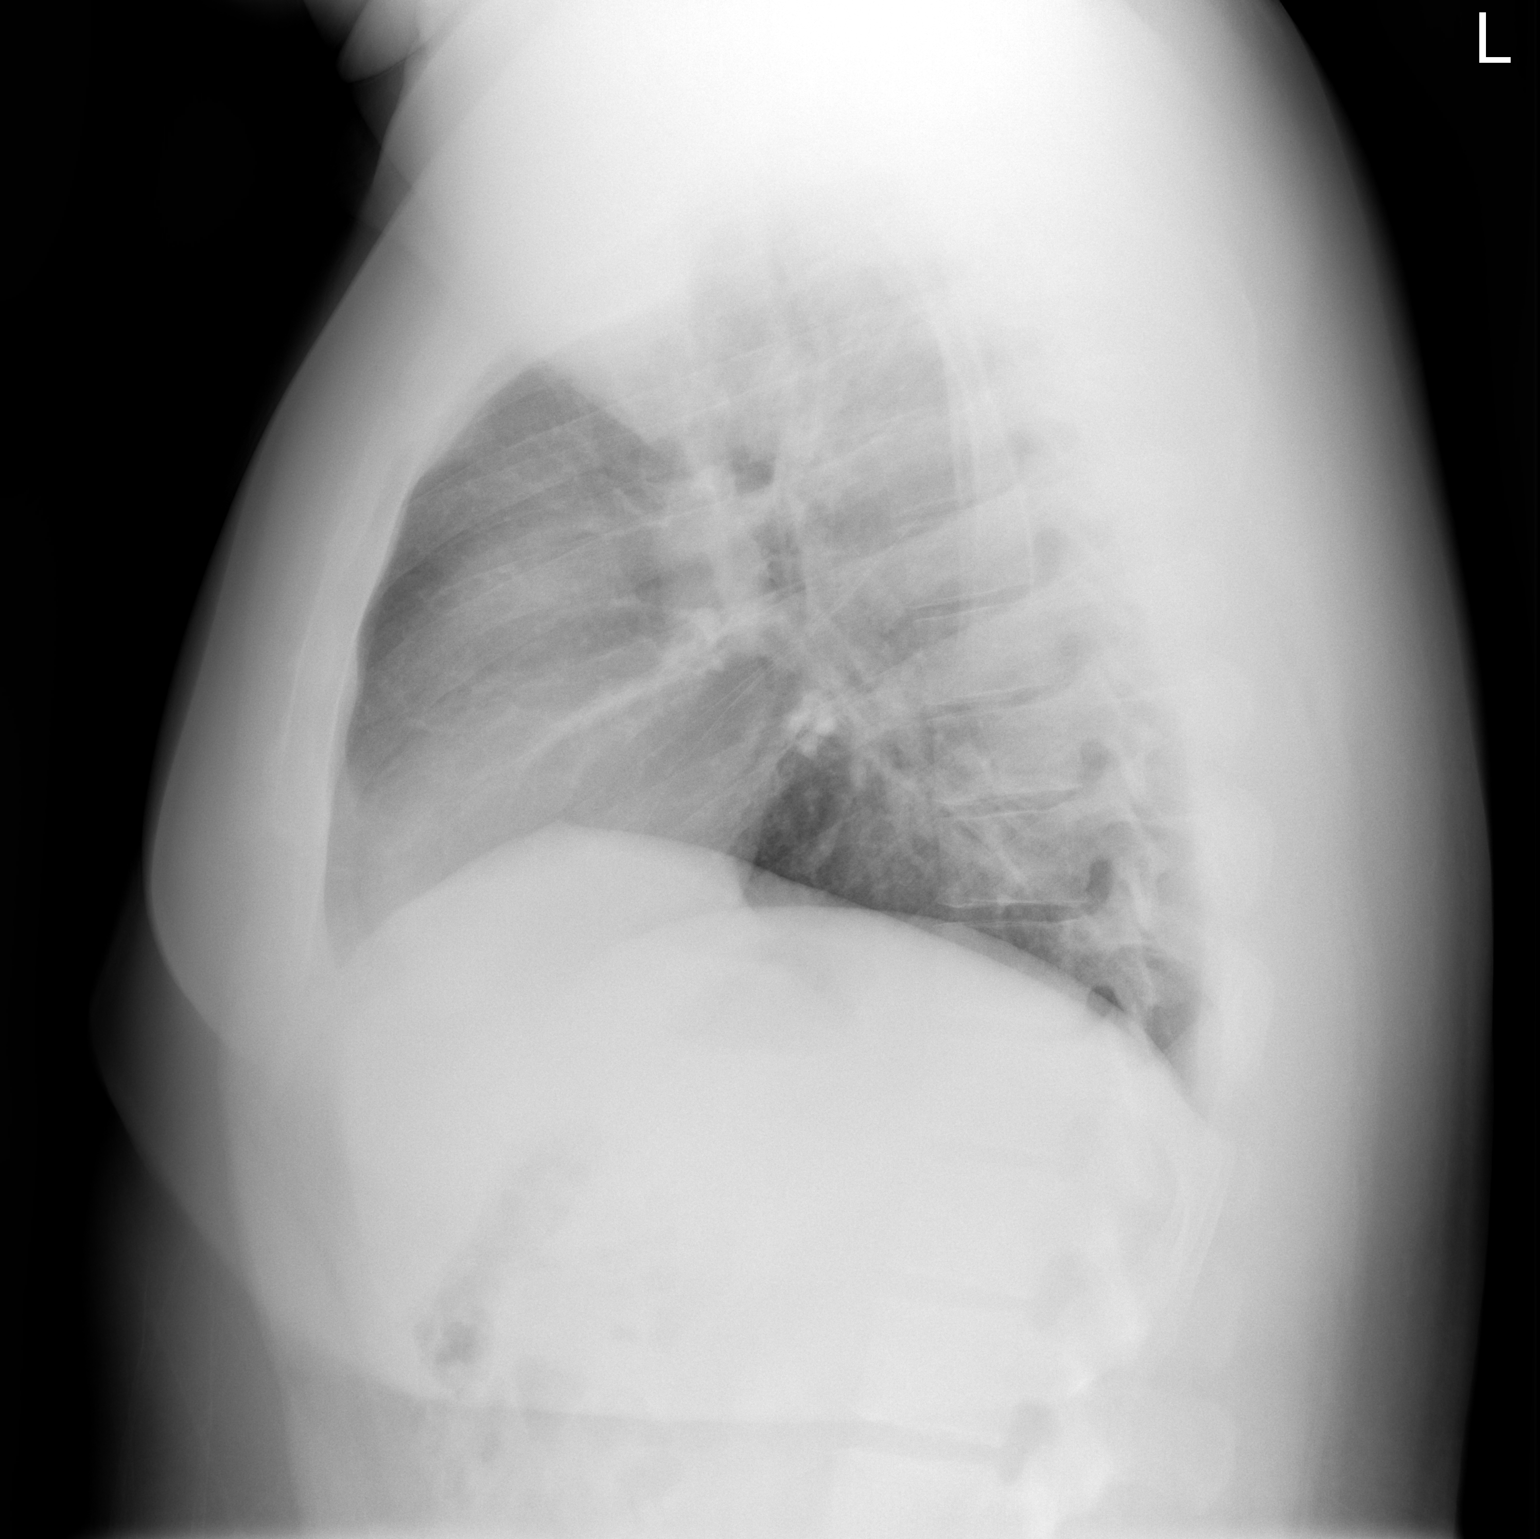

[2 of 2 positions shown; findings below may reference images not displayed]

FINDINGS: The heart size and mediastinal contours are within normal limits.
Both lungs are clear. The visualized skeletal structures are
unremarkable.
IMPRESSION: No active cardiopulmonary disease.

## 2017-06-28 ENCOUNTER — Emergency Department (HOSPITAL_BASED_OUTPATIENT_CLINIC_OR_DEPARTMENT_OTHER): Payer: BLUE CROSS/BLUE SHIELD

## 2017-06-28 ENCOUNTER — Encounter (HOSPITAL_BASED_OUTPATIENT_CLINIC_OR_DEPARTMENT_OTHER): Payer: Self-pay | Admitting: *Deleted

## 2017-06-28 ENCOUNTER — Other Ambulatory Visit: Payer: Self-pay

## 2017-06-28 ENCOUNTER — Emergency Department (HOSPITAL_BASED_OUTPATIENT_CLINIC_OR_DEPARTMENT_OTHER)
Admission: EM | Admit: 2017-06-28 | Discharge: 2017-06-28 | Disposition: A | Discharge status: Home / Self Care | Payer: BLUE CROSS/BLUE SHIELD | Attending: Emergency Medicine | Admitting: Emergency Medicine

## 2017-06-28 DIAGNOSIS — R079 Chest pain, unspecified: Secondary | ICD-10-CM | POA: Diagnosis not present

## 2017-06-28 DIAGNOSIS — J4520 Mild intermittent asthma, uncomplicated: Secondary | ICD-10-CM | POA: Diagnosis not present

## 2017-06-28 DIAGNOSIS — Z79899 Other long term (current) drug therapy: Secondary | ICD-10-CM | POA: Diagnosis not present

## 2017-06-28 DIAGNOSIS — I10 Essential (primary) hypertension: Secondary | ICD-10-CM | POA: Insufficient documentation

## 2017-06-28 LAB — BASIC METABOLIC PANEL
Anion gap: 8 (ref 5–15)
BUN: 14 mg/dL (ref 6–20)
CO2: 26 mmol/L (ref 22–32)
Calcium: 9.3 mg/dL (ref 8.9–10.3)
Chloride: 105 mmol/L (ref 101–111)
Creatinine, Ser: 0.89 mg/dL (ref 0.61–1.24)
GFR calc Af Amer: >60 mL/min (ref >60)
GFR calc non Af Amer: >60 mL/min (ref >60)
Glucose, Bld: 98 mg/dL (ref 65–99)
Potassium: 3.9 mmol/L (ref 3.5–5.1)
Sodium: 139 mmol/L (ref 135–145)

## 2017-06-28 LAB — CBC WITH DIFFERENTIAL/PLATELET
Basophils Absolute: 0 10*3/uL (ref 0.0–0.1)
Basophils Relative: 0 %
Eosinophils Absolute: 0.1 10*3/uL (ref 0.0–0.7)
Eosinophils Relative: 2 %
HCT: 44.6 % (ref 39.0–52.0)
Hemoglobin: 14.9 g/dL (ref 13.0–17.0)
Lymphocytes Relative: 34 %
Lymphs Abs: 2.9 10*3/uL (ref 0.7–4.0)
MCH: 26.4 pg (ref 26.0–34.0)
MCHC: 33.4 g/dL (ref 30.0–36.0)
MCV: 78.9 fL (ref 78.0–100.0)
Monocytes Absolute: 0.6 10*3/uL (ref 0.1–1.0)
Monocytes Relative: 8 %
Neutro Abs: 4.7 10*3/uL (ref 1.7–7.7)
Neutrophils Relative %: 56 %
Platelets: 266 10*3/uL (ref 150–400)
RBC: 5.65 MIL/uL (ref 4.22–5.81)
RDW: 14.2 % (ref 11.5–15.5)
WBC: 8.3 10*3/uL (ref 4.0–10.5)

## 2017-06-28 LAB — TROPONIN I: Troponin I: <0.03 ng/mL (ref <0.03)

## 2017-06-28 NOTE — ED Notes (Signed)
ED Provider at bedside. 

## 2017-06-28 NOTE — ED Triage Notes (Signed)
Went to MD's office on Monday for physical exam and chest pain.  EKG done, negative.  Flexeril was prescribed because during  PE, patient voiced pain on his chest, upper back and when she lifted his left arm, patient also c/o pain.  Patient took Flexeril last night and at 4 am. He woke up this morning feeling like an elephant is sitting on his chest.

## 2017-06-28 NOTE — ED Provider Notes (Signed)
[facility] EMERGENCY DEPARTMENT Provider Note   CSN: [id number] Arrival date & time: 06/28/17  4098     History   Chief Complaint Chief Complaint  Patient presents with  . Chest Pain    nausea    HPI Zachary Sanford is a 27 y.o. male.  HPI   27 year old male with chest pain.  Pain is in the center of his chest.  He was seen for the same by his PCP on Monday.  Felt to be musculoskeletal given the reproducibility.  He was prescribed Flexeril which he is taking a few doses of which did not seem to help much.  He denies any associated symptoms such as dyspnea, nausea, diaphoresis or palpitations.  No fevers or chills.  No unusual leg pain or swelling.  Past Medical History:  Diagnosis Date  . Arthritis   . Asthma   . Condylomata acuminata   . Crohn disease (HCC)   . Depression   . Frequent headaches   . History of chicken pox   . Hypertension     Patient Active Problem List   Diagnosis Date Noted  . Severe obesity (BMI >= 40) (HCC) 02/06/2014  . Asthma, mild intermittent 02/06/2014  . Arthritis of knee 02/06/2014  . Elevated blood pressure 02/06/2014  . Frequent headaches 02/06/2014  . Depression 12/16/2010  . IBS (irritable bowel syndrome) 12/16/2010    Past Surgical History:  Procedure Laterality Date  . MENISCUS REPAIR  2012        Home Medications    Prior to Admission medications   Medication Sig Start Date End Date Taking? Authorizing Provider  pantoprazole (PROTONIX) 20 MG tablet Take 20 mg by mouth daily.   Yes [provider]  divalproex (DEPAKOTE ER) 500 MG 24 hr tablet Take 500 mg by mouth 3 (three) times daily.    [provider]  docusate sodium (COLACE) 100 MG capsule Take 1 capsule (100 mg total) by mouth every 12 (twelve) hours. 02/03/15   Rancour, Jeannett Senior, MD  HYDROcodone-homatropine (HYCODAN) 5-1.5 MG/5ML syrup Take 5 mLs by mouth every 6 (six) hours as needed for cough. 09/09/14   Gilda Crease, MD    hydrocortisone (ANUSOL-HC) 2.5 % rectal cream Apply rectally 2 times daily 02/03/15   Rancour, Jeannett Senior, MD  ibuprofen (ADVIL) 200 MG tablet Take 1 tablet (200 mg total) by mouth every 6 (six) hours as needed. 09/09/14   Gilda Crease, MD  omeprazole (PRILOSEC) 20 MG capsule TAKE ONE CAPSULE BY MOUTH EVERY DAY 03/02/15   Lorre Munroe, NP  ondansetron (ZOFRAN) 4 MG tablet Take 1 tablet (4 mg total) by mouth every 8 (eight) hours as needed. 02/10/14   Lorre Munroe, NP  podofilox (CONDYLOX) 0.5 % gel Apply topically 2 (two) times daily. 07/14/14   Lorre Munroe, NP  promethazine (PHENERGAN) 25 MG tablet Take 1 tablet (25 mg total) by mouth every 6 (six) hours as needed for nausea or vomiting. 09/09/14   Pollina, Canary Brim, MD  SUMAtriptan (IMITREX) 25 MG tablet Take 50 mg by mouth every 2 (two) hours as needed for migraine or headache. May repeat in 2 hours if headache persists or recurs.    [provider]  SUMAtriptan (IMITREX) 50 MG tablet Take 1 tablet by mouth for severe headache, can repeat in 2 hours as needed Patient taking differently: Take 100 mg by mouth every 2 (two) hours as needed. Take 1 tablet by mouth for severe headache, can repeat in 2  hours as needed 12/19/14   Lorre Munroe, NP  topiramate (TOPAMAX) 50 MG tablet Take 1 tablet (50 mg total) by mouth daily. PATIENT NEEDS OFFICE VISIT FOR ADDITIONAL REFILLS 09/29/14   Lorre Munroe, NP    Family History Family History  Problem Relation Age of Onset  . Diabetes Mother   . Arthritis Mother   . Hyperlipidemia Mother   . Hypertension Mother   . Mental illness Mother   . Alcohol abuse Father   . Drug abuse Father   . Cancer Father        colon  . Hyperlipidemia Father   . Arthritis Maternal Grandmother   . Hyperlipidemia Maternal Grandmother   . Arthritis Maternal Grandfather   . Hyperlipidemia Maternal Grandfather   . Arthritis Paternal Grandmother   . Hyperlipidemia Paternal Grandmother   .  Hypertension Paternal Grandmother   . Arthritis Paternal Grandfather   . Cancer Paternal Grandfather        colon and prostate  . Hyperlipidemia Paternal Grandfather   . Diabetes Paternal Grandfather     Social History Social History   Tobacco Use  . Smoking status: Never Smoker  . Smokeless tobacco: Never Used  Substance Use Topics  . Alcohol use: Yes    Alcohol/week: 0.0 oz    Comment: social  . Drug use: No     Allergies   Lisinopril and Mushroom extract complex   Review of Systems Review of Systems  All systems reviewed and negative, other than as noted in HPI.  Physical Exam Updated Vital Signs BP (!) 141/97   Pulse 90   Temp 98.2 F (36.8 C) (Oral)   Resp 15   Ht 5\' 10"  (1.778 m)   Wt (!) 157.9 kg (348 lb)   SpO2 99%   BMI 49.93 kg/m   Physical Exam  Constitutional: He appears well-developed and well-nourished. No distress.  Laying in bed. no acute distress.  Obese.  HENT:  Head: Normocephalic and atraumatic.  Eyes: Conjunctivae are normal. Right eye exhibits no discharge. Left eye exhibits no discharge.  Neck: Neck supple.  Cardiovascular: Normal rate, regular rhythm and normal heart sounds. Exam reveals no gallop and no friction rub.  No murmur heard. Pulmonary/Chest: Effort normal and breath sounds normal. No respiratory distress. He exhibits tenderness.  Tenderness to palpation over the right parasternal border.  There are no overlying skin changes.  Pain is reproducible with movement of the shoulders.  Abdominal: Soft. He exhibits no distension. There is no tenderness.  Musculoskeletal: He exhibits no edema or tenderness.  Lower extremities symmetric as compared to each other. No calf tenderness. Negative Homan's. No palpable cords.   Neurological: He is alert.  Skin: Skin is warm and dry.  Psychiatric: He has a normal mood and affect. His behavior is normal. Thought content normal.  Nursing note and vitals reviewed.    ED Treatments /  Results  Labs (all labs ordered are listed, but only abnormal results are displayed) Labs Reviewed  TROPONIN I  CBC WITH DIFFERENTIAL/PLATELET  BASIC METABOLIC PANEL    EKG EKG Interpretation  Date/Time:  Wednesday June 28 2017 09:17:15 EDT Ventricular Rate:  91 PR Interval:    QRS Duration: 93 QT Interval:  343 QTC Calculation: 422 R Axis:   77 Text Interpretation:  Sinus rhythm ST elev, probable normal early repol pattern Baseline wander in lead(s) V5 Confirmed by Raeford Razor [phone]) on 06/28/2017 9:26:47 AM   Radiology Dg Chest 2 View  Result Date:  06/28/2017 CLINICAL DATA:  LEFT side chest pain into LEFT shoulder since last Tuesday, no improvement, history asthma, Crohn's disease EXAM: CHEST - 2 VIEW COMPARISON:  09/09/2014 FINDINGS: Normal heart size, mediastinal contours, and pulmonary vascularity. Minimal elevation of RIGHT diaphragm unchanged with minimal RIGHT basilar atelectasis. No acute infiltrate, pleural effusion or pneumothorax. Bones unremarkable. IMPRESSION: No acute abnormalities. Electronically Signed   By: Ulyses SouthwardMark  Boles M.D.   On: 06/28/2017 10:18    Procedures Procedures (including critical care time)  Medications Ordered in ED Medications - No data to display   Initial Impression / Assessment and Plan / ED Course  I have reviewed the triage vital signs and the nursing notes.  Pertinent labs & imaging results that were available during my care of the patient were reviewed by me and considered in my medical decision making (see chart for details).     27 year old male with chest pain.  Atypical for ACS.  Doubt PE, dissection or other emergent process.  I potentially be musculoskeletal.  It is reproducible on my exam as well with palpation and certain movements.  Though he describes it though seems more consistent with possible reflux.  He was started on a PPI.  Advised to continue.  Return precautions were discussed.  Outpatient follow-up with his PCP  again otherwise.  Final Clinical Impressions(s) / ED Diagnoses   Final diagnoses:  Chest pain, unspecified type    ED Discharge Orders    None       Raeford RazorKohut, Odas Ozer, MD 06/30/17 1420

## 2017-06-28 NOTE — ED Notes (Signed)
Pt on monitor 

## 2017-10-20 ENCOUNTER — Emergency Department: Admit: 2017-10-20

## 2017-10-20 ENCOUNTER — Inpatient Hospital Stay
Admit: 2017-10-20 | Discharge: 2017-10-20 | Disposition: A | Discharge status: Home / Self Care | Attending: Emergency Medicine

## 2017-10-20 DIAGNOSIS — S93402A Sprain of unspecified ligament of left ankle, initial encounter: Secondary | ICD-10-CM

## 2017-10-20 MED ORDER — IBUPROFEN 400 MG PO TABS
400 MG | Freq: Once | ORAL | Status: DC
Start: 2017-10-20 — End: 2017-10-20

## 2017-10-20 NOTE — ED Provider Notes (Signed)
CHIEF COMPLAINT  No chief complaint on file.      HISTORY OF PRESENT ILLNESS  Joshua Elliott is a 27 y.o. male, who presents to the ED with moderate severe right ankle and foot pain after tripping and falling down several steps patient was carrying laundry and tripped missing several steps sustaining an inversion injury to the right ankle as well as heel trauma complains of moderate severe pain in the heel and lateral part of his right ankle worse with ambulation no weakness no numbness no paresthesias.  No other complaints,modifying factors or associated symptoms.     Review of Systems    I have reviewed the following from the nursing documentation.    No past medical history on file.  No past surgical history on file.  No family history on file.  Social History     Socioeconomic History   ??? Marital status: Single     Spouse name: Not on file   ??? Number of children: Not on file   ??? Years of education: Not on file   ??? Highest education level: Not on file   Occupational History   ??? Not on file   Social Needs   ??? Financial resource strain: Not on file   ??? Food insecurity:     Worry: Not on file     Inability: Not on file   ??? Transportation needs:     Medical: Not on file     Non-medical: Not on file   Tobacco Use   ??? Smoking status: Not on file   Substance and Sexual Activity   ??? Alcohol use: Not on file   ??? Drug use: Not on file   ??? Sexual activity: Not on file   Lifestyle   ??? Physical activity:     Days per week: Not on file     Minutes per session: Not on file   ??? Stress: Not on file   Relationships   ??? Social connections:     Talks on phone: Not on file     Gets together: Not on file     Attends religious service: Not on file     Active member of club or organization: Not on file     Attends meetings of clubs or organizations: Not on file     Relationship status: Not on file   ??? Intimate partner violence:     Fear of current or ex partner: Not on file     Emotionally abused: Not on file     Physically abused:  Not on file     Forced sexual activity: Not on file   Other Topics Concern   ??? Not on file   Social History Narrative   ??? Not on file        No current facility-administered medications for this encounter.      No current outpatient medications on file.     Allergies not on file  @ROSBYAGE @       PHYSICAL EXAM  BP (!) 144/90    Pulse 97    Temp 98.7 ??F (37.1 ??C) (Oral)    Resp 16    Ht 5\' 11"  (1.803 m)    Wt (!) 137 kg (302 lb)    SpO2 100%    BMI 42.12 kg/m??   GENERAL APPEARANCE: Awake and alert. Cooperative. In no acute distress.  EXTREMITIES: Examination of the right lower extremity shows mild tenderness palpation of the calcaneus on the plantar surface  as well as the anterior talofibular ligaments mild soft tissue swelling no ecchymosis no crepitus no deformity no fifth metatarsal tenderness no Achilles tendon defect negative Thompson's full strength and sensation dorsalis pedis pulse is normal sKIN: Warm and dry.    Physical Exam    LABORATORY STUDIES:  Labs Reviewed - No data to display     RADIOLOGY  XR FOOT RIGHT (MIN 3 VIEWS)   Final Result      Right ankle: No acute osseous abnormality.      Right foot:   1. No acute osseous abnormality.   2. Mild first MTP osteoarthritis.      XR ANKLE RIGHT (MIN 3 VIEWS)   Final Result      Right ankle: No acute osseous abnormality.      Right foot:   1. No acute osseous abnormality.   2. Mild first MTP osteoarthritis.          PROCEDURES  Procedures    ED COURSE/MDM  Patient seen and evaluated. Old records reviewed if pertinent. Labs and imaging reviewed and results discussed withpatient.    I considered heel contusion, ankle sprain, fracture, dislocation.  Plan of care discussed with patient or family as appropriate. Patient or family in agreement with plan.     If discharged, patient was given scripts for the following medications.    New Prescriptions    No medications on file       CLINICAL IMPRESSION  1. Elevated blood pressure reading    2. Sprain of left ankle,  unspecified ligament, initial encounter        Blood pressure (!) 144/90, pulse 97, temperature 98.7 ??F (37.1 ??C), temperature source Oral, resp. rate 16, height 5\' 11"  (1.803 m), weight (!) 137 kg (302 lb), SpO2 100 %.    DISPOSITION  Joshua Elliott was discharged in stable condition.                     Enis Gash, MD  10/20/17 [phone]

## 2018-01-19 ENCOUNTER — Other Ambulatory Visit: Payer: Self-pay

## 2018-01-19 ENCOUNTER — Encounter (HOSPITAL_BASED_OUTPATIENT_CLINIC_OR_DEPARTMENT_OTHER): Payer: Self-pay

## 2018-01-19 ENCOUNTER — Emergency Department (HOSPITAL_BASED_OUTPATIENT_CLINIC_OR_DEPARTMENT_OTHER)
Admission: EM | Admit: 2018-01-19 | Discharge: 2018-01-19 | Disposition: A | Discharge status: Home / Self Care | Payer: Self-pay | Attending: Emergency Medicine | Admitting: Emergency Medicine

## 2018-01-19 DIAGNOSIS — J0100 Acute maxillary sinusitis, unspecified: Secondary | ICD-10-CM | POA: Insufficient documentation

## 2018-01-19 DIAGNOSIS — H6691 Otitis media, unspecified, right ear: Secondary | ICD-10-CM | POA: Insufficient documentation

## 2018-01-19 DIAGNOSIS — I10 Essential (primary) hypertension: Secondary | ICD-10-CM | POA: Insufficient documentation

## 2018-01-19 DIAGNOSIS — H6690 Otitis media, unspecified, unspecified ear: Secondary | ICD-10-CM

## 2018-01-19 DIAGNOSIS — J45909 Unspecified asthma, uncomplicated: Secondary | ICD-10-CM | POA: Insufficient documentation

## 2018-01-19 MED ORDER — AMOXICILLIN-POT CLAVULANATE 875-125 MG PO TABS: 1.0000 | ORAL_TABLET | Freq: Two times a day (BID) | ORAL | 0 refills | Status: DC

## 2018-01-19 MED ORDER — ACETAMINOPHEN 500 MG PO TABS
1000.0000 mg | ORAL_TABLET | Freq: Once | ORAL | Status: AC
Start: 2018-01-19 — End: 2018-01-19
  Administered 2018-01-19: 1000 mg via ORAL
  Filled 2018-01-19: qty 2

## 2018-01-19 MED FILL — AMOX-CLAV 875-125 MG TABLET: 875-125 | 7 days supply | Qty: 14 | Fill #0

## 2018-01-19 NOTE — ED Notes (Signed)
ED Provider at bedside. 

## 2018-01-19 NOTE — Discharge Instructions (Addendum)
You were evaluated in the Emergency Department and after careful evaluation, we did not find any emergent condition requiring admission or further testing in the hospital.  Your symptoms today seem to be due to an ear infection and also possibly a sinus infection.  Please use the antibiotics as directed and follow-up with your regular doctor.  Please return to the Emergency Department if you experience any worsening of your condition.  We encourage you to follow up with a primary care provider.  Thank you for allowing [place] to be a part of your care.

## 2018-01-19 NOTE — ED Triage Notes (Signed)
C/o URI sx x 5 days-NAD-steady gait 

## 2018-01-19 NOTE — ED Provider Notes (Signed)
[facility] Emergency Department Provider Note MRN:  [id number]  Arrival date & time: 01/19/18     Chief Complaint   URI   History of Present Illness   Zachary Sanford is a 28 y.o. year-old male with a history of migraines presenting to the ED with chief complaint of URI.  Patient had a upper respiratory infection 1 to 2 weeks ago, improved, and since then has been experiencing persistent right-sided headache and ear pain for the past 2 to 3 days.  Feels a pressure-like sensation below his right eye, and his right-sided teeth.  Feels pain and pressure behind his right ear.  Denies neck pain, no fever, no chest pain or shortness of breath, cough is resolved, no numbness or weakness to the arms or legs.  Review of Systems  A complete 10 system review of systems was obtained and all systems are negative except as noted in the HPI and PMH.   Patient's Health History    Past Medical History:  Diagnosis Date  . Arthritis   . Asthma   . Condylomata acuminata   . Crohn disease (HCC)   . Depression   . Frequent headaches   . History of chicken pox   . Hypertension     Past Surgical History:  Procedure Laterality Date  . MENISCUS REPAIR  2012    Family History  Problem Relation Age of Onset  . Diabetes Mother   . Arthritis Mother   . Hyperlipidemia Mother   . Hypertension Mother   . Mental illness Mother   . Alcohol abuse Father   . Drug abuse Father   . Cancer Father        colon  . Hyperlipidemia Father   . Arthritis Maternal Grandmother   . Hyperlipidemia Maternal Grandmother   . Arthritis Maternal Grandfather   . Hyperlipidemia Maternal Grandfather   . Arthritis Paternal Grandmother   . Hyperlipidemia Paternal Grandmother   . Hypertension Paternal Grandmother   . Arthritis Paternal Grandfather   . Cancer Paternal Grandfather        colon and prostate  . Hyperlipidemia Paternal Grandfather   . Diabetes Paternal Grandfather       Social History   Socioeconomic History  . Marital status: Single    Spouse name: Not on file  . Number of children: Not on file  . Years of education: Not on file  . Highest education level: Not on file  Occupational History  . Not on file  Social Needs  . Financial resource strain: Not on file  . Food insecurity:    Worry: Not on file    Inability: Not on file  . Transportation needs:    Medical: Not on file    Non-medical: Not on file  Tobacco Use  . Smoking status: Never Smoker  . Smokeless tobacco: Never Used  Substance and Sexual Activity  . Alcohol use: Yes    Alcohol/week: 0.0 standard drinks    Comment: social  . Drug use: No  . Sexual activity: Not on file  Lifestyle  . Physical activity:    Days per week: Not on file    Minutes per session: Not on file  . Stress: Not on file  Relationships  . Social connections:    Talks on phone: Not on file    Gets together: Not on file    Attends religious service: Not on file    Active member of club or organization: Not on file  Attends meetings of clubs or organizations: Not on file    Relationship status: Not on file  . Intimate partner violence:    Fear of current or ex partner: Not on file    Emotionally abused: Not on file    Physically abused: Not on file    Forced sexual activity: Not on file  Other Topics Concern  . Not on file  Social History Narrative  . Not on file     Physical Exam  Vital Signs and Nursing Notes reviewed Vitals:   01/19/18 1604 01/19/18 1636  BP:  (!) 129/94  Pulse: (!) 120 (!) 122  Resp: 20 20  Temp:  100.2 F (37.9 C)  SpO2: 100% 100%    CONSTITUTIONAL: Well-appearing, NAD NEURO:  Alert and oriented x 3, no focal deficits EYES:  eyes equal and reactive ENT/NECK:  no LAD, no JVD; effusion behind right TM with erythematous appearance; tenderness to palpation to the right maxillary sinus CARDIO: Tachycardic rate, well-perfused, normal S1 and S2 PULM:  CTAB no wheezing or  rhonchi GI/GU:  normal bowel sounds, non-distended, non-tender MSK/SPINE:  No gross deformities, no edema SKIN:  no rash, atraumatic PSYCH:  Appropriate speech and behavior  Diagnostic and Interventional Summary    Labs Reviewed - No data to display  No orders to display    Medications  acetaminophen (TYLENOL) tablet 1,000 mg (1,000 mg Oral Given 01/19/18 1533)     Procedures Critical Care  ED Course and Medical Decision Making  I have reviewed the triage vital signs and the nursing notes.  Pertinent labs & imaging results that were available during my care of the patient were reviewed by me and considered in my medical decision making (see below for details).  History and exam consistent with otitis media versus right-sided maxillary sinusitis.  The only complicating factor being his heart rate of 120, will attempt oral hydration and reassess.  Patient has no neurological deficits, no meningismus.  Clinical Course as of Jan 19 1641  Fri Jan 19, 2018  1531 Tachycardia now seems explained by fever, rechecked at 100.5.  Will provide Tylenol, continued p.o. fluids, reassess.  Patient continues to look well, nontoxic, no meningismus, low concern for significant bacterial infection.   [MB]    Clinical Course User Index [MB] Sabas SousBero, Aleyda Gindlesperger M, MD     Heart rate improved to 114 on my reassessment.  Patient continues to look and feel well.  Requesting discharge.  I explained to patient that I prefer heart rate to improve more prior to discharge.  Patient explains he has been taking Sudafed multiple times a day for the past week, including this morning.  Tachycardia a known side effect of this medication.  Patient has a nurse at home that he lives with, agrees to return to the emergency department with any worsening of condition.  After the discussed management above, the patient was determined to be safe for discharge.  The patient was in agreement with this plan and all questions regarding  their care were answered.  ED return precautions were discussed and the patient will return to the ED with any significant worsening of condition.  Elmer SowMichael M. Pilar PlateBero, MD [facility] [email or web address]  Final Clinical Impressions(s) / ED Diagnoses     ICD-10-CM   1. Acute non-recurrent maxillary sinusitis J01.00   2. Acute otitis media, unspecified otitis media type H66.90     ED Discharge Orders  Ordered    amoxicillin-clavulanate (AUGMENTIN) 875-125 MG tablet  Every 12 hours     01/19/18 1642             Sabas Sous, MD 01/19/18 1644

## 2019-05-27 ENCOUNTER — Ambulatory Visit: Payer: Medicaid Other | Attending: Internal Medicine

## 2019-05-27 DIAGNOSIS — Z20822 Contact with and (suspected) exposure to covid-19: Secondary | ICD-10-CM

## 2019-05-28 LAB — NOVEL CORONAVIRUS, NAA: SARS-CoV-2, NAA: NOT DETECTED

## 2019-05-28 LAB — SARS-COV-2, NAA 2 DAY TAT

## 2019-08-09 ENCOUNTER — Ambulatory Visit
Admission: EM | Admit: 2019-08-09 | Discharge: 2019-08-09 | Disposition: A | Discharge status: Home / Self Care | Payer: 59 | Attending: Physician Assistant | Admitting: Physician Assistant

## 2019-08-09 ENCOUNTER — Other Ambulatory Visit: Payer: Self-pay

## 2019-08-09 DIAGNOSIS — J029 Acute pharyngitis, unspecified: Secondary | ICD-10-CM | POA: Diagnosis not present

## 2019-08-09 DIAGNOSIS — R509 Fever, unspecified: Secondary | ICD-10-CM | POA: Diagnosis not present

## 2019-08-09 LAB — POCT RAPID STREP A (OFFICE): Rapid Strep A Screen: NEGATIVE

## 2019-08-09 MED ORDER — CEPHALEXIN 500 MG PO CAPS: 500.0000 mg | ORAL_CAPSULE | Freq: Four times a day (QID) | ORAL | 0 refills | Status: DC

## 2019-08-09 MED ORDER — LIDOCAINE VISCOUS HCL 2 % MT SOLN: 15.0000 mL | OROMUCOSAL | 0 refills | Status: DC | PRN

## 2019-08-09 NOTE — ED Triage Notes (Signed)
Pt c/o sore throat and fever since yesterday. Last tylenol at 0830 this morning. Hx of strep.

## 2019-08-09 NOTE — ED Provider Notes (Signed)
[facility]    CSN: [id number] Arrival date & time: 08/09/19  6283      History   Chief Complaint Chief Complaint  Patient presents with  . Sore Throat    HPI Mansfield Chastin Riesgo is a 29 y.o. male.   29 year odl male comes in for 2 day of sore throat, fever. Denies rhinorrhea, nasal congestion, cough. Fever, tmax 102, responsive to antipyretic. Denies body aches. Denies abdominal pain, nausea, vomiting, diarrhea. Denies shortness of breath, loss of taste/smell. No COVID vaccine. No known sick contact.      Past Medical History:  Diagnosis Date  . Arthritis   . Asthma   . Condylomata acuminata   . Crohn disease (HCC)   . Depression   . Frequent headaches   . History of chicken pox   . Hypertension     Patient Active Problem List   Diagnosis Date Noted  . Severe obesity (BMI >= 40) (HCC) 02/06/2014  . Asthma, mild intermittent 02/06/2014  . Arthritis of knee 02/06/2014  . Elevated blood pressure 02/06/2014  . Frequent headaches 02/06/2014  . Depression 12/16/2010  . IBS (irritable bowel syndrome) 12/16/2010    Past Surgical History:  Procedure Laterality Date  . MENISCUS REPAIR  2012       Home Medications    Prior to Admission medications   Medication Sig Start Date End Date Taking? Authorizing Provider  amLODipine (NORVASC) 10 MG tablet Take by mouth. 07/12/17   [provider]  cephALEXin (KEFLEX) 500 MG capsule Take 1 capsule (500 mg total) by mouth 4 (four) times daily. 08/09/19   Belinda Fisher, PA-C  hydrochlorothiazide (HYDRODIURIL) 25 MG tablet Take by mouth. 06/14/16   [provider]  lidocaine (XYLOCAINE) 2 % solution Use as directed 15 mLs in the mouth or throat as needed for mouth pain. 08/09/19   Cathie Hoops, Deeanne Deininger V, PA-C  SUMAtriptan (IMITREX) 25 MG tablet Take 50 mg by mouth every 2 (two) hours as needed for migraine or headache. May repeat in 2 hours if headache persists or recurs.    [provider]  SUMAtriptan  (IMITREX) 50 MG tablet Take 1 tablet by mouth for severe headache, can repeat in 2 hours as needed Patient taking differently: Take 100 mg by mouth every 2 (two) hours as needed. Take 1 tablet by mouth for severe headache, can repeat in 2 hours as needed 12/19/14   Lorre Munroe, NP    Family History Family History  Problem Relation Age of Onset  . Diabetes Mother   . Arthritis Mother   . Hyperlipidemia Mother   . Hypertension Mother   . Mental illness Mother   . Alcohol abuse Father   . Drug abuse Father   . Cancer Father        colon  . Hyperlipidemia Father   . Arthritis Maternal Grandmother   . Hyperlipidemia Maternal Grandmother   . Arthritis Maternal Grandfather   . Hyperlipidemia Maternal Grandfather   . Arthritis Paternal Grandmother   . Hyperlipidemia Paternal Grandmother   . Hypertension Paternal Grandmother   . Arthritis Paternal Grandfather   . Cancer Paternal Grandfather        colon and prostate  . Hyperlipidemia Paternal Grandfather   . Diabetes Paternal Grandfather     Social History Social History   Tobacco Use  . Smoking status: Never Smoker  . Smokeless tobacco: Never Used  Vaping Use  . Vaping Use: Never used  Substance Use Topics  .  Alcohol use: Yes    Alcohol/week: 0.0 standard drinks    Comment: social  . Drug use: No     Allergies   Lisinopril and Mushroom extract complex   Review of Systems Review of Systems  Reason unable to perform ROS: See HPI as above.     Physical Exam Triage Vital Signs ED Triage Vitals  Enc Vitals Group     BP 08/09/19 0954 (!) 138/95     Pulse Rate 08/09/19 0954 96     Resp 08/09/19 0954 18     Temp 08/09/19 0954 98.5 F (36.9 C)     Temp Source 08/09/19 0954 Oral     SpO2 08/09/19 0954 97 %     Weight --      Height --      Head Circumference --      Peak Flow --      Pain Score 08/09/19 0955 8     Pain Loc --      Pain Edu? --      Excl. in GC? --    No data found.  Updated Vital  Signs BP (!) 138/95 (BP Location: Left Arm)   Pulse 96   Temp 98.5 F (36.9 C) (Oral)   Resp 18   SpO2 97%   Physical Exam Constitutional:      General: He is not in acute distress.    Appearance: He is well-developed. He is not ill-appearing, toxic-appearing or diaphoretic.  HENT:     Head: Normocephalic and atraumatic.     Right Ear: Tympanic membrane, ear canal and external ear normal. Tympanic membrane is not erythematous or bulging.     Left Ear: Tympanic membrane, ear canal and external ear normal. Tympanic membrane is not erythematous or bulging.     Nose:     Right Sinus: No maxillary sinus tenderness or frontal sinus tenderness.     Left Sinus: No maxillary sinus tenderness or frontal sinus tenderness.     Mouth/Throat:     Mouth: Mucous membranes are moist.     Pharynx: Oropharynx is clear. Uvula midline.     Tonsils: Tonsillar exudate present. 3+ on the right. 3+ on the left.  Eyes:     Conjunctiva/sclera: Conjunctivae normal.     Pupils: Pupils are equal, round, and reactive to light.  Cardiovascular:     Rate and Rhythm: Normal rate and regular rhythm.  Pulmonary:     Effort: Pulmonary effort is normal. No accessory muscle usage, prolonged expiration, respiratory distress or retractions.     Breath sounds: No decreased air movement or transmitted upper airway sounds. No decreased breath sounds.     Comments: LCTAB Musculoskeletal:     Cervical back: Normal range of motion and neck supple.  Skin:    General: Skin is warm and dry.  Neurological:     Mental Status: He is alert and oriented to person, place, and time.    UC Treatments / Results  Labs (all labs ordered are listed, but only abnormal results are displayed) Labs Reviewed  POCT RAPID STREP A (OFFICE) - Normal  CULTURE, GROUP A STREP ([facility])  NOVEL CORONAVIRUS, NAA    EKG   Radiology No results found.  Procedures Procedures (including critical care time)  Medications Ordered in  UC Medications - No data to display  Initial Impression / Assessment and Plan / UC Course  I have reviewed the triage vital signs and the nursing notes.  Pertinent labs & imaging results  that were available during my care of the patient were reviewed by me and considered in my medical decision making (see chart for details).    Rapid strep negative.  However, given history and exam, will cover for tonsillitis with abx. Patient with history of strep 3-4x a year, states unresponsive to amoxicillin. Usually on keflex 500mg  Q6H x 10 days. COVID testing ordered as well, to quarantine until testing results return. Other symptomatic treatment discussed.  Return precautions given.  Patient expresses understanding and agrees to plan.  Final Clinical Impressions(s) / UC Diagnoses   Final diagnoses:  Sore throat  Fever, unspecified   ED Prescriptions    Medication Sig Dispense Auth. Provider   cephALEXin (KEFLEX) 500 MG capsule Take 1 capsule (500 mg total) by mouth 4 (four) times daily. 40 capsule Trea Carnegie V, PA-C   lidocaine (XYLOCAINE) 2 % solution Use as directed 15 mLs in the mouth or throat as needed for mouth pain. 100 mL , PA-C     PDMP not reviewed this encounter.   Belinda Fisher, PA-C 08/09/19 1048

## 2019-08-09 NOTE — Discharge Instructions (Signed)
Rapid strep negative. However, given your exam, will cover you empirically for bacterial infection with keflex. As discussed, symptoms can still be due to viral illness/ drainage down your throat. This usually takes 7-10 days to resolve. COVID testing ordered, please quarantine until testing results return.  Start lidocaine for sore throat, do not eat or drink for the next 40 mins after use as it can stunt your gag reflex. You can take over the counter allergy medicine such as Flonase for nasal congestion/drainage. You can use over the counter nasal saline rinse such as neti pot for nasal congestion. Monitor for any worsening of symptoms, swelling of the throat, trouble breathing, trouble swallowing, leaning forward to breath, drooling, go to the emergency department for further evaluation needed.

## 2019-08-10 LAB — SARS-COV-2, NAA 2 DAY TAT

## 2019-08-10 LAB — NOVEL CORONAVIRUS, NAA: SARS-CoV-2, NAA: NOT DETECTED

## 2019-08-11 LAB — CULTURE, GROUP A STREP (THRC)

## 2019-08-28 MED FILL — AMLODIPINE BESYLATE 10 MG T: 10 | 90 days supply | Qty: 90 | Fill #0

## 2019-09-10 DIAGNOSIS — Z0000 Encounter for general adult medical examination without abnormal findings: Secondary | ICD-10-CM | POA: Diagnosis not present

## 2019-09-10 DIAGNOSIS — K589 Irritable bowel syndrome without diarrhea: Secondary | ICD-10-CM | POA: Diagnosis not present

## 2019-09-10 DIAGNOSIS — I10 Essential (primary) hypertension: Secondary | ICD-10-CM | POA: Diagnosis not present

## 2019-09-10 DIAGNOSIS — Z1159 Encounter for screening for other viral diseases: Secondary | ICD-10-CM | POA: Diagnosis not present

## 2019-09-10 DIAGNOSIS — Z20822 Contact with and (suspected) exposure to covid-19: Secondary | ICD-10-CM | POA: Diagnosis not present

## 2020-03-09 ENCOUNTER — Other Ambulatory Visit: Payer: Self-pay

## 2020-03-09 ENCOUNTER — Ambulatory Visit
Admission: RE | Admit: 2020-03-09 | Discharge: 2020-03-09 | Disposition: A | Discharge status: Home / Self Care | Payer: PRIVATE HEALTH INSURANCE | Source: Ambulatory Visit | Attending: Physician Assistant | Admitting: Physician Assistant

## 2020-03-09 VITALS — BP 122/80 | HR 95 | Temp 98.0°F | Resp 18

## 2020-03-09 DIAGNOSIS — J0301 Acute recurrent streptococcal tonsillitis: Secondary | ICD-10-CM | POA: Insufficient documentation

## 2020-03-09 LAB — POCT RAPID STREP A (OFFICE): Rapid Strep A Screen: NEGATIVE

## 2020-03-09 MED ORDER — CEPHALEXIN 250 MG PO CAPS: 250.0000 mg | ORAL_CAPSULE | Freq: Four times a day (QID) | ORAL | 0 refills | Status: AC

## 2020-03-09 NOTE — Discharge Instructions (Addendum)
Cover for strep, given the appearance of your exam. Treat with antibiotics as directed for 10 days. FU as needed

## 2020-03-09 NOTE — ED Provider Notes (Signed)
[facility]    CSN: [id number] Arrival date & time: 03/09/20  0856      History   Chief Complaint Chief Complaint  Patient presents with  . Sore Throat    HPI [place] Zeitz is a 30 y.o. male.   Presents with a 5 day history of sore throat and malaise. Prior history of strep 2-3 times a year. No fevers are noted. No cough or congestion     Past Medical History:  Diagnosis Date  . Arthritis   . Asthma   . Condylomata acuminata   . Crohn disease (HCC)   . Depression   . Frequent headaches   . History of chicken pox   . Hypertension     Patient Active Problem List   Diagnosis Date Noted  . Severe obesity (BMI >= 40) (HCC) 02/06/2014  . Asthma, mild intermittent 02/06/2014  . Arthritis of knee 02/06/2014  . Elevated blood pressure 02/06/2014  . Frequent headaches 02/06/2014  . Depression 12/16/2010  . IBS (irritable bowel syndrome) 12/16/2010    Past Surgical History:  Procedure Laterality Date  . MENISCUS REPAIR  2012       Home Medications    Prior to Admission medications   Medication Sig Start Date End Date Taking? Authorizing Provider  amLODipine (NORVASC) 10 MG tablet Take by mouth. 07/12/17   [provider]  hydrochlorothiazide (HYDRODIURIL) 25 MG tablet Take by mouth. 06/14/16   [provider]  lidocaine (XYLOCAINE) 2 % solution Use as directed 15 mLs in the mouth or throat as needed for mouth pain. 08/09/19   Cathie Hoops, Amy V, PA-C  SUMAtriptan (IMITREX) 25 MG tablet Take 50 mg by mouth every 2 (two) hours as needed for migraine or headache. May repeat in 2 hours if headache persists or recurs.    [provider]  SUMAtriptan (IMITREX) 50 MG tablet Take 1 tablet by mouth for severe headache, can repeat in 2 hours as needed Patient taking differently: Take 100 mg by mouth every 2 (two) hours as needed. Take 1 tablet by mouth for severe headache, can repeat in 2 hours as needed 12/19/14   Lorre Munroe, NP     Family History Family History  Problem Relation Age of Onset  . Diabetes Mother   . Arthritis Mother   . Hyperlipidemia Mother   . Hypertension Mother   . Mental illness Mother   . Alcohol abuse Father   . Drug abuse Father   . Cancer Father        colon  . Hyperlipidemia Father   . Arthritis Maternal Grandmother   . Hyperlipidemia Maternal Grandmother   . Arthritis Maternal Grandfather   . Hyperlipidemia Maternal Grandfather   . Arthritis Paternal Grandmother   . Hyperlipidemia Paternal Grandmother   . Hypertension Paternal Grandmother   . Arthritis Paternal Grandfather   . Cancer Paternal Grandfather        colon and prostate  . Hyperlipidemia Paternal Grandfather   . Diabetes Paternal Grandfather     Social History Social History   Tobacco Use  . Smoking status: Never Smoker  . Smokeless tobacco: Never Used  Vaping Use  . Vaping Use: Never used  Substance Use Topics  . Alcohol use: Yes    Alcohol/week: 0.0 standard drinks    Comment: social  . Drug use: No     Allergies   Lisinopril and Mushroom extract complex   Review of Systems Review of Systems  Constitutional: Positive for  fatigue. Negative for fever.  HENT: Positive for sore throat. Negative for congestion.   Eyes: Negative.   Respiratory: Negative.   Skin: Negative for rash.  All other systems reviewed and are negative.    Physical Exam Triage Vital Signs ED Triage Vitals  Enc Vitals Group     BP 03/09/20 0905 122/80     Pulse Rate 03/09/20 0905 95     Resp 03/09/20 0905 18     Temp 03/09/20 0905 98 F (36.7 C)     Temp Source 03/09/20 0905 Oral     SpO2 03/09/20 0905 97 %     Weight --      Height --      Head Circumference --      Peak Flow --      Pain Score 03/09/20 0904 5     Pain Loc --      Pain Edu? --      Excl. in GC? --    No data found.  Updated Vital Signs BP 122/80 (BP Location: Left Arm)   Pulse 95   Temp 98 F (36.7 C) (Oral)   Resp 18   SpO2 97%    Visual Acuity Right Eye Distance:   Left Eye Distance:   Bilateral Distance:    Right Eye Near:   Left Eye Near:    Bilateral Near:     Physical Exam Vitals and nursing note reviewed.  Constitutional:      Appearance: He is well-developed and normal weight.  HENT:     Head: Normocephalic and atraumatic.     Nose: No congestion or rhinorrhea.     Mouth/Throat:     Mouth: Mucous membranes are moist. No oral lesions.     Pharynx: Pharyngeal swelling, oropharyngeal exudate and posterior oropharyngeal erythema present.     Comments: +3 tonsils with erythema, exudate Pulmonary:     Effort: Pulmonary effort is normal. No respiratory distress.  Skin:    General: Skin is warm and dry.  Neurological:     General: No focal deficit present.     Mental Status: He is alert.  Psychiatric:        Mood and Affect: Mood normal.      UC Treatments / Results  Labs (all labs ordered are listed, but only abnormal results are displayed) Labs Reviewed  POCT RAPID STREP A (OFFICE)    EKG   Radiology No results found.  Procedures Procedures (including critical care time)  Medications Ordered in UC Medications - No data to display  Initial Impression / Assessment and Plan / UC Course  I have reviewed the triage vital signs and the nursing notes.  Pertinent labs & imaging results that were available during my care of the patient were reviewed by me and considered in my medical decision making (see chart for details).    Treat based on clinical presentation of strep. Culture pending. Symptomatic care Final Clinical Impressions(s) / UC Diagnoses   Final diagnoses:  None   Discharge Instructions   None    ED Prescriptions    None     PDMP not reviewed this encounter.   Riki Sheer, [place] 03/09/20 [phone]

## 2020-03-09 NOTE — ED Triage Notes (Signed)
Pt c/o sore throat and swollen tonsils x5 days. States hx of strep and this feels the same.

## 2020-03-10 LAB — CULTURE, GROUP A STREP (THRC)

## 2020-03-11 LAB — CULTURE, GROUP A STREP (THRC)

## 2020-03-27 ENCOUNTER — Ambulatory Visit: Payer: Self-pay

## 2020-06-22 ENCOUNTER — Encounter: Payer: Self-pay | Admitting: Emergency Medicine

## 2020-06-22 ENCOUNTER — Other Ambulatory Visit: Payer: Self-pay

## 2020-06-22 ENCOUNTER — Ambulatory Visit
Admission: EM | Admit: 2020-06-22 | Discharge: 2020-06-22 | Disposition: A | Discharge status: Home / Self Care | Payer: BC Managed Care – PPO | Attending: Emergency Medicine | Admitting: Emergency Medicine

## 2020-06-22 DIAGNOSIS — J039 Acute tonsillitis, unspecified: Secondary | ICD-10-CM

## 2020-06-22 DIAGNOSIS — Z1152 Encounter for screening for COVID-19: Secondary | ICD-10-CM

## 2020-06-22 DIAGNOSIS — Z20822 Contact with and (suspected) exposure to covid-19: Secondary | ICD-10-CM

## 2020-06-22 LAB — POCT RAPID STREP A (OFFICE): Rapid Strep A Screen: NEGATIVE

## 2020-06-22 MED ORDER — ACETAMINOPHEN 325 MG PO TABS
650.0000 mg | ORAL_TABLET | Freq: Once | ORAL | Status: AC
  Administered 2020-06-22: 650 mg via ORAL

## 2020-06-22 MED ORDER — CEPHALEXIN 500 MG PO CAPS: 500.0000 mg | ORAL_CAPSULE | Freq: Four times a day (QID) | ORAL | 0 refills | Status: AC

## 2020-06-22 MED ORDER — IBUPROFEN 800 MG PO TABS: 800.0000 mg | ORAL_TABLET | Freq: Three times a day (TID) | ORAL | 0 refills | Status: DC

## 2020-06-22 NOTE — Discharge Instructions (Signed)
Strep negative, COVID and flu test pending Tylenol and ibuprofen for body aches, fevers, sore throat Begin Keflex for tonsillitis, if COVID or flu-positive please stop this medicine Please continue to monitor symptoms, follow-up if not improving or worsening

## 2020-06-22 NOTE — ED Triage Notes (Signed)
Pt sts sore throat fever and gen weakness starting today

## 2020-06-22 NOTE — ED Provider Notes (Signed)
[facility]    CSN: [id number] Arrival date & time: 06/22/20  1249      History   Chief Complaint Chief Complaint  Patient presents with  . Sore Throat  . Fever    HPI Zachary Sanford is a 30 y.o. male presenting today for evaluation of fever and sore throat.  Reports that yesterday began to have fatigue and generalized weakness, today woke up with swollen tonsils and fever.  Noted to be 101.8 at work earlier today, took ibuprofen 400 mg.  Denies any significant cough or congestion.  Reports recurrent history of tonsillitis.  HPI  Past Medical History:  Diagnosis Date  . Arthritis   . Asthma   . Condylomata acuminata   . Crohn disease (HCC)   . Depression   . Frequent headaches   . History of chicken pox   . Hypertension     Patient Active Problem List   Diagnosis Date Noted  . Severe obesity (BMI >= 40) (HCC) 02/06/2014  . Asthma, mild intermittent 02/06/2014  . Arthritis of knee 02/06/2014  . Elevated blood pressure 02/06/2014  . Frequent headaches 02/06/2014  . Depression 12/16/2010  . IBS (irritable bowel syndrome) 12/16/2010    Past Surgical History:  Procedure Laterality Date  . MENISCUS REPAIR  2012       Home Medications    Prior to Admission medications   Medication Sig Start Date End Date Taking? Authorizing Provider  cephALEXin (KEFLEX) 500 MG capsule Take 1 capsule (500 mg total) by mouth 4 (four) times daily for 10 days. 06/22/20 07/02/20 Yes Maylea Soria C, PA-C  ibuprofen (ADVIL) 800 MG tablet Take 1 tablet (800 mg total) by mouth 3 (three) times daily. 06/22/20  Yes Katrinia Straker C, PA-C  amLODipine (NORVASC) 10 MG tablet Take by mouth. 07/12/17   [provider]  hydrochlorothiazide (HYDRODIURIL) 25 MG tablet Take by mouth. 06/14/16   [provider]  lidocaine (XYLOCAINE) 2 % solution Use as directed 15 mLs in the mouth or throat as needed for mouth pain. 08/09/19   Cathie Hoops, Amy V, PA-C  SUMAtriptan  (IMITREX) 25 MG tablet Take 50 mg by mouth every 2 (two) hours as needed for migraine or headache. May repeat in 2 hours if headache persists or recurs.    [provider]  SUMAtriptan (IMITREX) 50 MG tablet Take 1 tablet by mouth for severe headache, can repeat in 2 hours as needed Patient taking differently: Take 100 mg by mouth every 2 (two) hours as needed. Take 1 tablet by mouth for severe headache, can repeat in 2 hours as needed 12/19/14   Lorre Munroe, NP    Family History Family History  Problem Relation Age of Onset  . Diabetes Mother   . Arthritis Mother   . Hyperlipidemia Mother   . Hypertension Mother   . Mental illness Mother   . Alcohol abuse Father   . Drug abuse Father   . Cancer Father        colon  . Hyperlipidemia Father   . Arthritis Maternal Grandmother   . Hyperlipidemia Maternal Grandmother   . Arthritis Maternal Grandfather   . Hyperlipidemia Maternal Grandfather   . Arthritis Paternal Grandmother   . Hyperlipidemia Paternal Grandmother   . Hypertension Paternal Grandmother   . Arthritis Paternal Grandfather   . Cancer Paternal Grandfather        colon and prostate  . Hyperlipidemia Paternal Grandfather   . Diabetes Paternal Grandfather  Social History Social History   Tobacco Use  . Smoking status: Never Smoker  . Smokeless tobacco: Never Used  Vaping Use  . Vaping Use: Never used  Substance Use Topics  . Alcohol use: Yes    Alcohol/week: 0.0 standard drinks    Comment: social  . Drug use: No     Allergies   Lisinopril and Mushroom extract complex   Review of Systems Review of Systems  Constitutional: Positive for chills, fatigue and fever. Negative for activity change and appetite change.  HENT: Positive for sore throat. Negative for congestion, ear pain, rhinorrhea, sinus pressure and trouble swallowing.   Eyes: Negative for discharge and redness.  Respiratory: Negative for cough, chest tightness and shortness of  breath.   Cardiovascular: Negative for chest pain.  Gastrointestinal: Negative for abdominal pain, diarrhea, nausea and vomiting.  Musculoskeletal: Negative for myalgias.  Skin: Negative for rash.  Neurological: Negative for dizziness, light-headedness and headaches.     Physical Exam Triage Vital Signs ED Triage Vitals [06/22/20 1403]  Enc Vitals Group     BP (!) 161/81     Pulse Rate (!) 125     Resp 18     Temp (!) 101.3 F (38.5 C)     Temp Source Oral     SpO2 96 %     Weight      Height      Head Circumference      Peak Flow      Pain Score 6     Pain Loc      Pain Edu?      Excl. in GC?    No data found.  Updated Vital Signs BP (!) 161/81 (BP Location: Left Arm)   Pulse (!) 125   Temp (!) 101.3 F (38.5 C) (Oral)   Resp 18   SpO2 96%   Visual Acuity Right Eye Distance:   Left Eye Distance:   Bilateral Distance:    Right Eye Near:   Left Eye Near:    Bilateral Near:     Physical Exam Vitals and nursing note reviewed.  Constitutional:      Appearance: He is well-developed.     Comments: No acute distress  HENT:     Head: Normocephalic and atraumatic.     Ears:     Comments: Bilateral ears without tenderness to palpation of external auricle, tragus and mastoid, EAC's without erythema or swelling, TM's with good bony landmarks and cone of light. Non erythematous.     Nose: Nose normal.     Mouth/Throat:     Comments: Bilateral tonsils appear enlarged, minimal erythema without significant exudate noted, uvula midline Eyes:     Conjunctiva/sclera: Conjunctivae normal.  Cardiovascular:     Rate and Rhythm: Normal rate.  Pulmonary:     Effort: Pulmonary effort is normal. No respiratory distress.     Comments: Breathing comfortably at rest, CTABL, no wheezing, rales or other adventitious sounds auscultated Abdominal:     General: There is no distension.  Musculoskeletal:        General: Normal range of motion.     Cervical back: Neck supple.   Skin:    General: Skin is warm and dry.  Neurological:     Mental Status: He is alert and oriented to person, place, and time.      UC Treatments / Results  Labs (all labs ordered are listed, but only abnormal results are displayed) Labs Reviewed  COVID-19, FLU A+B NAA  CULTURE, GROUP A STREP [facility])  POCT RAPID STREP A (OFFICE)    EKG   Radiology No results found.  Procedures Procedures (including critical care time)  Medications Ordered in UC Medications  acetaminophen (TYLENOL) tablet 650 mg (650 mg Oral Given 06/22/20 1407)    Initial Impression / Assessment and Plan / UC Course  I have reviewed the triage vital signs and the nursing notes.  Pertinent labs & imaging results that were available during my care of the patient were reviewed by me and considered in my medical decision making (see chart for details).     Strep test negative, COVID and flu test pending, will opt to go ahead and proceed with tonsillitis treatment given patient's history, enlarged tonsils as well as associated fever, advised if COVID or flu test positive likely antibiotics will not help with symptoms.  Prescribing Keflex as this is helped him in the past without problem.  Ibuprofen and Tylenol for pain and swelling as well.  Push fluids.  Discussed strict return precautions. Patient verbalized understanding and is agreeable with plan.  Final Clinical Impressions(s) / UC Diagnoses   Final diagnoses:  Encounter for screening laboratory testing for COVID-19 virus  Acute tonsillitis, unspecified etiology     Discharge Instructions     Strep negative, COVID and flu test pending Tylenol and ibuprofen for body aches, fevers, sore throat Begin Keflex for tonsillitis, if COVID or flu-positive please stop this medicine Please continue to monitor symptoms, follow-up if not improving or worsening     ED Prescriptions    Medication Sig Dispense Auth. Provider   cephALEXin (KEFLEX) 500 MG  capsule Take 1 capsule (500 mg total) by mouth 4 (four) times daily for 10 days. 40 capsule Nyheem Binette C, PA-C   ibuprofen (ADVIL) 800 MG tablet Take 1 tablet (800 mg total) by mouth 3 (three) times daily. 21 tablet Kimani Bedoya, Ovid C, PA-C     PDMP not reviewed this encounter.   Lew Dawes, PA-C 06/22/20 1524

## 2020-06-24 LAB — CULTURE, GROUP A STREP (THRC)

## 2020-06-25 LAB — COVID-19, FLU A+B NAA
Influenza A, NAA: NOT DETECTED
Influenza B, NAA: NOT DETECTED
SARS-CoV-2, NAA: DETECTED — AB

## 2020-07-30 ENCOUNTER — Emergency Department
Admission: RE | Admit: 2020-07-30 | Discharge: 2020-07-30 | Disposition: A | Discharge status: Home / Self Care | Payer: BC Managed Care – PPO | Source: Ambulatory Visit | Attending: Family Medicine | Admitting: Family Medicine

## 2020-07-30 ENCOUNTER — Other Ambulatory Visit: Payer: Self-pay

## 2020-07-30 VITALS — BP 120/89 | HR 124 | Temp 99.1°F | Resp 18

## 2020-07-30 DIAGNOSIS — L03116 Cellulitis of left lower limb: Secondary | ICD-10-CM

## 2020-07-30 MED ORDER — CEPHALEXIN 500 MG PO CAPS: 500.0000 mg | ORAL_CAPSULE | Freq: Three times a day (TID) | ORAL | 0 refills | Status: DC

## 2020-07-30 MED ORDER — HYDROCODONE-ACETAMINOPHEN 7.5-325 MG PO TABS: 1.0000 | ORAL_TABLET | Freq: Four times a day (QID) | ORAL | 0 refills | Status: DC | PRN

## 2020-07-30 MED ORDER — CEFTRIAXONE SODIUM 1 G IJ SOLR
1000.0000 mg | Freq: Once | INTRAMUSCULAR | Status: AC
  Administered 2020-07-30: 1000 mg via INTRAMUSCULAR

## 2020-07-30 NOTE — ED Triage Notes (Signed)
Pt c/o pain and swelling in LT leg. Recent tattoo. Swelling in ankle. Also painful to bare weight. Also has blistering on some areas of tattoo. Pain 6/10 Tylenol and Motrin prn. Warm compresses and ice tried.

## 2020-07-30 NOTE — ED Provider Notes (Addendum)
[facility]    CSN: [id number] Arrival date & time: 07/30/20  1848      History   Chief Complaint Chief Complaint  Patient presents with   Cellulitis    HPI Zachary Sanford is a 30 y.o. male.   HPI  Patient has a new tattoo on his leg.  He states it was immediately more painful than his other tattoos.  Over the last couple of days it has become more painful, more red, and his entire leg is swollen.  He thinks he may have infection.  Today he noticed that he had swelling in his groin on the left in addition.  No malaise or fever.  No diabetes.  Past Medical History:  Diagnosis Date   Arthritis    Asthma    Condylomata acuminata    Crohn disease (HCC)    Depression    Frequent headaches    History of chicken pox    Hypertension     Patient Active Problem List   Diagnosis Date Noted   Severe obesity (BMI >= 40) (HCC) 02/06/2014   Asthma, mild intermittent 02/06/2014   Arthritis of knee 02/06/2014   Elevated blood pressure 02/06/2014   Frequent headaches 02/06/2014   Depression 12/16/2010   IBS (irritable bowel syndrome) 12/16/2010    Past Surgical History:  Procedure Laterality Date   MENISCUS REPAIR  2012       Home Medications    Prior to Admission medications   Medication Sig Start Date End Date Taking? Authorizing Provider  cephALEXin (KEFLEX) 500 MG capsule Take 1 capsule (500 mg total) by mouth 3 (three) times daily. 07/30/20  Yes Eustace Moore, MD  HYDROcodone-acetaminophen [facility]) 7.5-325 MG tablet Take 1 tablet by mouth every 6 (six) hours as needed for moderate pain. 07/30/20  Yes Eustace Moore, MD  amLODipine (NORVASC) 10 MG tablet Take by mouth. 07/12/17   [provider]  chlorthalidone (HYGROTON) 25 MG tablet Take 25 mg by mouth daily. 06/06/20   [provider]  ibuprofen (ADVIL) 800 MG tablet Take 1 tablet (800 mg total) by mouth 3 (three) times daily. 06/22/20   Wieters, Hallie C, PA-C  SUMAtriptan  (IMITREX) 50 MG tablet Take 1 tablet by mouth for severe headache, can repeat in 2 hours as needed Patient taking differently: Take 100 mg by mouth every 2 (two) hours as needed. Take 1 tablet by mouth for severe headache, can repeat in 2 hours as needed 12/19/14   Lorre Munroe, NP    Family History Family History  Problem Relation Age of Onset   Diabetes Mother    Arthritis Mother    Hyperlipidemia Mother    Hypertension Mother    Mental illness Mother    Alcohol abuse Father    Drug abuse Father    Cancer Father        colon   Hyperlipidemia Father    Arthritis Maternal Grandmother    Hyperlipidemia Maternal Grandmother    Arthritis Maternal Grandfather    Hyperlipidemia Maternal Grandfather    Arthritis Paternal Grandmother    Hyperlipidemia Paternal Grandmother    Hypertension Paternal Grandmother    Arthritis Paternal Grandfather    Cancer Paternal Grandfather        colon and prostate   Hyperlipidemia Paternal Grandfather    Diabetes Paternal Grandfather     Social History Social History   Tobacco Use   Smoking status: Never   Smokeless tobacco: Never  Vaping Use  Vaping Use: Never used  Substance Use Topics   Alcohol use: Yes    Alcohol/week: 0.0 standard drinks    Comment: social   Drug use: No     Allergies   Lisinopril and Mushroom extract complex   Review of Systems Review of Systems  See HPI  Physical Exam Triage Vital Signs ED Triage Vitals  Enc Vitals Group     BP 07/30/20 1904 120/89     Pulse Rate 07/30/20 1904 (!) 124     Resp 07/30/20 1904 (!) 8     Temp 07/30/20 1904 99.1 F (37.3 C)     Temp Source 07/30/20 1904 Oral     SpO2 07/30/20 1904 96 %     Weight --      Height --      Head Circumference --      Peak Flow --      Pain Score 07/30/20 1905 6     Pain Loc --      Pain Edu? --      Excl. in GC? --    No data found.  Updated Vital Signs BP 120/89 (BP Location: Right Arm)   Pulse (!) 124   Temp 99.1 F (37.3  C) (Oral)   Resp 18   SpO2 96%      Physical Exam Constitutional:      General: He is not in acute distress.    Appearance: He is well-developed. He is obese.  HENT:     Head: Normocephalic and atraumatic.     Nose:     Comments: Is wearing mask Eyes:     Conjunctiva/sclera: Conjunctivae normal.     Pupils: Pupils are equal, round, and reactive to light.  Cardiovascular:     Rate and Rhythm: Normal rate.  Pulmonary:     Effort: Pulmonary effort is normal. No respiratory distress.  Abdominal:     General: There is no distension.     Palpations: Abdomen is soft.  Musculoskeletal:        General: Normal range of motion.     Cervical back: Normal range of motion.     Left lower leg: Edema present.  Skin:    General: Skin is warm and dry.     Findings: Erythema present.     Comments: Patient has a large tattoo in the left lateral calf.  Around this there is a halo of erythema.  The whole area is swollen.  There is pedal edema that pits to the foot.  He also has tender left inguinal adenopathy.  Neurological:     General: No focal deficit present.     Mental Status: He is alert.  Psychiatric:        Mood and Affect: Mood normal.        Behavior: Behavior normal.     UC Treatments / Results  Labs (all labs ordered are listed, but only abnormal results are displayed) Labs Reviewed - No data to display  EKG   Radiology No results found.  Procedures Procedures (including critical care time)  Medications Ordered in UC Medications  cefTRIAXone (ROCEPHIN) injection 1,000 mg (1,000 mg Intramuscular Given 07/30/20 1927)    Initial Impression / Assessment and Plan / UC Course  I have reviewed the triage vital signs and the nursing notes.  Pertinent labs & imaging results that were available during my care of the patient were reviewed by me and considered in my medical decision making (see chart for details).  Cellulitis.  We will treat with an initial shot of  Rocephin.  Antibiotics.  Pain management.  Discussed the importance of limiting weightbearing and elevating leg to reduce edema.  This is essential to healing.  Offered note for work which she declines.  Return if not improving in a couple of days Final Clinical Impressions(s) / UC Diagnoses   Final diagnoses:  Cellulitis of leg, left     Discharge Instructions      You must stay off of your feet.  Elevate leg above level of heart.  Walk only as much as you have to for meals and bathroom Take the antibiotic 3 times a day Take pain medicine as needed.  Do not drive on pain medicine.  You should probably take the pain medicine with food. Expect improvement over the next 48 hours.  If you get worse instead of better, spiking fever, or feel sick you need to go the emergency room If this fails to resolve with antibiotics you need to return or see your PCP     ED Prescriptions     Medication Sig Dispense Auth. Provider   cephALEXin (KEFLEX) 500 MG capsule Take 1 capsule (500 mg total) by mouth 3 (three) times daily. 30 capsule Eustace Moore, MD   HYDROcodone-acetaminophen [facility]) 7.5-325 MG tablet Take 1 tablet by mouth every 6 (six) hours as needed for moderate pain. 15 tablet Eustace Moore, MD      I have reviewed the PDMP during this encounter.   Eustace Moore, MD 07/30/20 1930    Eustace Moore, MD 07/30/20 Barry Brunner

## 2020-07-30 NOTE — Discharge Instructions (Addendum)
You must stay off of your feet.  Elevate leg above level of heart.  Walk only as much as you have to for meals and bathroom Take the antibiotic 3 times a day Take pain medicine as needed.  Do not drive on pain medicine.  You should probably take the pain medicine with food. Expect improvement over the next 48 hours.  If you get worse instead of better, spiking fever, or feel sick you need to go the emergency room If this fails to resolve with antibiotics you need to return or see your PCP

## 2022-05-28 ENCOUNTER — Encounter (HOSPITAL_BASED_OUTPATIENT_CLINIC_OR_DEPARTMENT_OTHER): Payer: Self-pay | Admitting: Emergency Medicine

## 2022-05-28 ENCOUNTER — Other Ambulatory Visit: Payer: Self-pay

## 2022-05-28 ENCOUNTER — Emergency Department (HOSPITAL_BASED_OUTPATIENT_CLINIC_OR_DEPARTMENT_OTHER): Payer: 59

## 2022-05-28 ENCOUNTER — Emergency Department (HOSPITAL_BASED_OUTPATIENT_CLINIC_OR_DEPARTMENT_OTHER)
Admission: EM | Admit: 2022-05-28 | Discharge: 2022-05-28 | Disposition: A | Discharge status: Home / Self Care | Payer: 59 | Attending: Emergency Medicine | Admitting: Emergency Medicine

## 2022-05-28 DIAGNOSIS — M25561 Pain in right knee: Secondary | ICD-10-CM | POA: Diagnosis not present

## 2022-05-28 DIAGNOSIS — M545 Low back pain, unspecified: Secondary | ICD-10-CM

## 2022-05-28 DIAGNOSIS — W19XXXA Unspecified fall, initial encounter: Secondary | ICD-10-CM

## 2022-05-28 DIAGNOSIS — W108XXA Fall (on) (from) other stairs and steps, initial encounter: Secondary | ICD-10-CM | POA: Insufficient documentation

## 2022-05-28 DIAGNOSIS — S80211A Abrasion, right knee, initial encounter: Secondary | ICD-10-CM | POA: Insufficient documentation

## 2022-05-28 DIAGNOSIS — S8991XA Unspecified injury of right lower leg, initial encounter: Secondary | ICD-10-CM | POA: Diagnosis present

## 2022-05-28 DIAGNOSIS — Y92039 Unspecified place in apartment as the place of occurrence of the external cause: Secondary | ICD-10-CM | POA: Diagnosis not present

## 2022-05-28 NOTE — ED Triage Notes (Signed)
Pt sts he fell through the steps at apt complex today; c/o RT knee, thigh and elbow pain; amb to triage

## 2022-05-28 NOTE — ED Provider Notes (Signed)
[place] [facility] Provider Note   CSN: [id number] Arrival date & time: 05/28/22  1545     History {Add pertinent medical, surgical, social history, OB history to HPI:1} Chief Complaint  Patient presents with   Zachary Sanford is a 32 y.o. male who presents to ED after wooden stairs broken underneath him and he fell. Patient complaining of lower back pain and right knee pain. States that his feels tingling down his right leg.  Denies head trauma, headache, blood thinners, vomiting/nausea, weakness, urine retention.   Fall       Home Medications Prior to Admission medications   Medication Sig Start Date End Date Taking? Authorizing Provider  amLODipine (NORVASC) 10 MG tablet Take by mouth. 07/12/17   [provider]  cephALEXin (KEFLEX) 500 MG capsule Take 1 capsule (500 mg total) by mouth 3 (three) times daily. 07/30/20   Eustace Moore, MD  chlorthalidone (HYGROTON) 25 MG tablet Take 25 mg by mouth daily. 06/06/20   [provider]  HYDROcodone-acetaminophen (NORCO) 7.5-325 MG tablet Take 1 tablet by mouth every 6 (six) hours as needed for moderate pain. 07/30/20   Eustace Moore, MD  ibuprofen (ADVIL) 800 MG tablet Take 1 tablet (800 mg total) by mouth 3 (three) times daily. 06/22/20   Wieters, Hallie C, PA-C  SUMAtriptan (IMITREX) 50 MG tablet Take 1 tablet by mouth for severe headache, can repeat in 2 hours as needed Patient taking differently: Take 100 mg by mouth every 2 (two) hours as needed. Take 1 tablet by mouth for severe headache, can repeat in 2 hours as needed 12/19/14   Lorre Munroe, NP      Allergies    Lisinopril and Mushroom extract complex    Review of Systems   Review of Systems  Physical Exam Updated Vital Signs BP (!) 158/99 (BP Location: Right Arm)   Pulse 94   Temp 98.6 F (37 C) (Oral)   Resp (!) 24   Ht 5\' 11"  (1.803 m)   Wt (!) 154.2 kg   SpO2 95%   BMI 47.42  kg/m  Physical Exam  ED Results / Procedures / Treatments   Labs (all labs ordered are listed, but only abnormal results are displayed) Labs Reviewed - No data to display  EKG None  Radiology No results found.  Procedures Procedures  {Document cardiac monitor, telemetry assessment procedure when appropriate:1}  Medications Ordered in ED Medications - No data to display  ED Course/ Medical Decision Making/ A&P   {   Click here for ABCD2, HEART and other calculatorsREFRESH Note before signing :1}                          Medical Decision Making  This patient presents to the ED after a fall, this involves an extensive number of treatment options, and is a complaint that carries with it a high risk of complications and morbidity.  The differential diagnosis includes  intracranial hemorrhage, subdural/epidural hematoma, vertebral fracture, spinal cord injury, muscle strain, skull fracture, fracture.   Co morbidities that complicate the patient evaluation  none   Imaging Studies ordered:  I ordered imaging studies including  -CT thoracic/lumbar spine:  No acute fracture or traumatic subluxation. No evidence for focal disc herniation, neural foraminal stenosis or central canal stenosis at any level. -Knee Xray: negative I independently visualized and interpreted imaging Shared results with patient I  agree with the radiologist interpretation   Problem List / ED Course / Critical interventions / Medication management  Patient presented for Fall. Patient with stable vitals and does not appear to be in distress. No head trauma. Active knee ROM intact but painful. Patient ambulatory. Pain to palpation of lower spine. CT thoracic/lumbar without concerns for fractures. No tenderness of flanks/abdomen, no abdominal skin changes, no masses palpated, patient declines hematuria - I am currently less concerned of organ lacerations. Educated patient that we do not have an MRI today, but  his right knee pain may need further evaluation with outpatient MRI if symptoms do not resolve within the next few days. Patient asking to leave before images resulted. Educated patient of risks of leaving before workup is complete.  Asking for knee brace. I have reviewed the patients home medicines and have made adjustments as needed Patient was given return precautions. Patient stable for discharge at this time. Patient verbalized understanding of plan.   DDx: These are considered less likely due to history of present illness and physical exam findings -Vertebral fracture: no step-off/crepitus/abnormalities palpated; imaging reassuring -Spinal cord injury: Nexus C-spine and Canadian head CT score of 0, no neurodeficits; CT reassuring -Fracture: No step-offs/crepitus/abnormalities palpated in head, neck, chest, upper extremities, lower extremities, pelvis  Risk Stratification Score:  Nexus C-spine: 0 Canadian Head CT: 0   Social Determinants of Health:  none       {Document critical care time when appropriate:1} {Document review of labs and clinical decision tools ie heart score, Chads2Vasc2 etc:1}  {Document your independent review of radiology images, and any outside records:1} {Document your discussion with family members, caretakers, and with consultants:1} {Document social determinants of health affecting pt's care:1} {Document your decision making why or why not admission, treatments were needed:1} Final Clinical Impression(s) / ED Diagnoses Final diagnoses:  None    Rx / DC Orders ED Discharge Orders     None

## 2022-05-28 NOTE — Discharge Instructions (Addendum)
It was a pleasure taking care of you today. Xray knee and CT lumbar spine without concern of fractures. CT thoracic spine has not yet resulted. I recommend close follow up with primary care provider since you might require further outpatient imaging.   Seek emergency care if experiencing new or worsening symptoms.

## 2022-06-03 ENCOUNTER — Emergency Department (HOSPITAL_BASED_OUTPATIENT_CLINIC_OR_DEPARTMENT_OTHER)
Admission: EM | Admit: 2022-06-03 | Discharge: 2022-06-03 | Disposition: A | Discharge status: Home / Self Care | Payer: 59 | Attending: Emergency Medicine | Admitting: Emergency Medicine

## 2022-06-03 ENCOUNTER — Emergency Department (HOSPITAL_BASED_OUTPATIENT_CLINIC_OR_DEPARTMENT_OTHER): Payer: 59

## 2022-06-03 ENCOUNTER — Other Ambulatory Visit: Payer: Self-pay

## 2022-06-03 ENCOUNTER — Encounter (HOSPITAL_BASED_OUTPATIENT_CLINIC_OR_DEPARTMENT_OTHER): Payer: Self-pay

## 2022-06-03 DIAGNOSIS — Z79899 Other long term (current) drug therapy: Secondary | ICD-10-CM | POA: Insufficient documentation

## 2022-06-03 DIAGNOSIS — R0789 Other chest pain: Secondary | ICD-10-CM | POA: Insufficient documentation

## 2022-06-03 DIAGNOSIS — I10 Essential (primary) hypertension: Secondary | ICD-10-CM | POA: Insufficient documentation

## 2022-06-03 DIAGNOSIS — R079 Chest pain, unspecified: Secondary | ICD-10-CM | POA: Diagnosis present

## 2022-06-03 LAB — COMPREHENSIVE METABOLIC PANEL
ALT: 33 U/L (ref 0–44)
AST: 26 U/L (ref 15–41)
Albumin: 4.1 g/dL (ref 3.5–5.0)
Alkaline Phosphatase: 68 U/L (ref 38–126)
Anion gap: 11 (ref 5–15)
BUN: 18 mg/dL (ref 6–20)
CO2: 24 mmol/L (ref 22–32)
Calcium: 9.3 mg/dL (ref 8.9–10.3)
Chloride: 103 mmol/L (ref 98–111)
Creatinine, Ser: 0.83 mg/dL (ref 0.61–1.24)
GFR, Estimated: >60 mL/min (ref >60)
Glucose, Bld: 98 mg/dL (ref 70–99)
Potassium: 3.7 mmol/L (ref 3.5–5.1)
Sodium: 138 mmol/L (ref 135–145)
Total Bilirubin: 0.3 mg/dL (ref 0.3–1.2)
Total Protein: 7.7 g/dL (ref 6.5–8.1)

## 2022-06-03 LAB — CBC WITH DIFFERENTIAL/PLATELET
Abs Immature Granulocytes: 0.04 10*3/uL (ref 0.00–0.07)
Basophils Absolute: 0 10*3/uL (ref 0.0–0.1)
Basophils Relative: 1 %
Eosinophils Absolute: 0.2 10*3/uL (ref 0.0–0.5)
Eosinophils Relative: 2 %
HCT: 46.4 % (ref 39.0–52.0)
Hemoglobin: 14.7 g/dL (ref 13.0–17.0)
Immature Granulocytes: 1 %
Lymphocytes Relative: 37 %
Lymphs Abs: 3.1 10*3/uL (ref 0.7–4.0)
MCH: 25.2 pg — ABNORMAL LOW (ref 26.0–34.0)
MCHC: 31.7 g/dL (ref 30.0–36.0)
MCV: 79.6 fL — ABNORMAL LOW (ref 80.0–100.0)
Monocytes Absolute: 0.7 10*3/uL (ref 0.1–1.0)
Monocytes Relative: 8 %
Neutro Abs: 4.3 10*3/uL (ref 1.7–7.7)
Neutrophils Relative %: 51 %
Platelets: 289 10*3/uL (ref 150–400)
RBC: 5.83 MIL/uL — ABNORMAL HIGH (ref 4.22–5.81)
RDW: 14.2 % (ref 11.5–15.5)
WBC: 8.4 10*3/uL (ref 4.0–10.5)
nRBC: 0 % (ref 0.0–0.2)

## 2022-06-03 LAB — TROPONIN I (HIGH SENSITIVITY): Troponin I (High Sensitivity): <2 ng/L (ref <18)

## 2022-06-03 MED ORDER — LIDOCAINE 5 % EX PTCH
1.0000 | MEDICATED_PATCH | Freq: Once | CUTANEOUS | Status: DC
  Administered 2022-06-03: 1 via TRANSDERMAL
  Filled 2022-06-03: qty 1

## 2022-06-03 MED ORDER — METHOCARBAMOL 500 MG PO TABS: 1000.0000 mg | ORAL_TABLET | Freq: Three times a day (TID) | ORAL | 0 refills | Status: DC | PRN

## 2022-06-03 MED ORDER — LIDOCAINE 5 % EX PTCH: 1.0000 | MEDICATED_PATCH | CUTANEOUS | 0 refills | Status: DC

## 2022-06-03 MED ORDER — KETOROLAC TROMETHAMINE 15 MG/ML IJ SOLN
15.0000 mg | Freq: Once | INTRAMUSCULAR | Status: AC
  Administered 2022-06-03: 15 mg via INTRAVENOUS
  Filled 2022-06-03: qty 1

## 2022-06-03 NOTE — Discharge Instructions (Signed)
If you develop recurrent, continued, or worsening chest pain, shortness of breath, fever, vomiting, abdominal or back pain, or any other new/concerning symptoms then return to the ER for evaluation.  

## 2022-06-03 NOTE — ED Triage Notes (Signed)
Patient is having mid sternal chest pain for three days. He stated its worse when he lays down or moves. Patient did fall through steps on 5/11.

## 2022-06-03 NOTE — ED Provider Notes (Signed)
[place] [facility] Provider Note   CSN: [id number] Arrival date & time: 06/03/22  1018     History  Chief Complaint  Patient presents with   Chest Pain    Zachary Sanford is a 32 y.o. male.  HPI 32 year old male with a history of hypertension presents with chest pain.  The pain is primarily over his sternum and a little left of it.  First noticed it about 3 days ago.  Last week he fell through steps but does not remember injuring his chest.  The pain is primarily worse with palpation and moving his arms.  No shortness of breath or abdominal pain.  He did injure his lower back that checked out okay according to the patient.  Has taken NSAIDs and Tylenol without much relief. Pain is primarily a pressure and sharp.  Home Medications Prior to Admission medications   Medication Sig Start Date End Date Taking? Authorizing Provider  albuterol (VENTOLIN HFA) 108 (90 Base) MCG/ACT inhaler Inhale 2 puffs into the lungs as needed for wheezing or shortness of breath.   Yes [provider]  amLODipine (NORVASC) 10 MG tablet Take by mouth. 07/12/17  Yes [provider]  amphetamine-dextroamphetamine (ADDERALL XR) 20 MG 24 hr capsule Take 20 mg by mouth every morning. 03/14/19  Yes [provider]  amphetamine-dextroamphetamine (ADDERALL) 20 MG tablet Take 20 mg by mouth daily in the afternoon. 03/14/19  Yes [provider]  busPIRone (BUSPAR) 5 MG tablet Take 5 mg by mouth 2 (two) times daily as needed (anxiety).   Yes [provider]  chlorthalidone (HYGROTON) 25 MG tablet Take 25 mg by mouth daily. 06/06/20  Yes [provider]  EPINEPHrine 0.3 mg/0.3 mL IJ SOAJ injection Inject 0.3 mg into the muscle as needed for anaphylaxis. 06/08/18  Yes [provider]  lidocaine (LIDODERM) 5 % Place 1 patch onto the skin daily. Remove & Discard patch within 12 hours or as directed by MD 06/03/22  Yes  Pricilla Loveless, MD  loratadine (CLARITIN) 10 MG tablet Take 10 mg by mouth at bedtime. 09/08/21  Yes [provider]  meloxicam (MOBIC) 15 MG tablet Take 15 mg by mouth daily. 05/30/22  Yes [provider]  methocarbamol (ROBAXIN) 500 MG tablet Take 2 tablets (1,000 mg total) by mouth every 8 (eight) hours as needed for muscle spasms. 06/03/22  Yes Pricilla Loveless, MD  nebivolol (BYSTOLIC) 10 MG tablet Take 10 mg by mouth daily.   Yes [provider]  triamcinolone cream (KENALOG) 0.5 % Apply 1 Application topically as needed (flare ups). 01/03/19  Yes [provider]  Ubrogepant (UBRELVY) 100 MG TABS Take 100 mg by mouth daily as needed (migraines).   Yes [provider]  Vitamin D, Ergocalciferol, (DRISDOL) 1.25 MG (50000 UNIT) CAPS capsule Take 50,000 Units by mouth once a week. Saturdays 03/02/22  Yes [provider]  cephALEXin (KEFLEX) 500 MG capsule Take 1 capsule (500 mg total) by mouth 3 (three) times daily. Patient not taking: Reported on 06/03/2022 07/30/20   Eustace Moore, MD  HYDROcodone-acetaminophen [facility]) 7.5-325 MG tablet Take 1 tablet by mouth every 6 (six) hours as needed for moderate pain. Patient not taking: Reported on 06/03/2022 07/30/20   Eustace Moore, MD  ibuprofen (ADVIL) 800 MG tablet Take 1 tablet (800 mg total) by mouth 3 (three) times daily. Patient not taking: Reported on 06/03/2022 06/22/20   Wieters, Fran Lowes C, PA-C  SUMAtriptan (IMITREX) 50  MG tablet Take 1 tablet by mouth for severe headache, can repeat in 2 hours as needed Patient not taking: Reported on 06/03/2022 12/19/14   Lorre Munroe, NP      Allergies    Lisinopril and Mushroom extract complex    Review of Systems   Review of Systems  Respiratory:  Negative for shortness of breath.   Cardiovascular:  Positive for chest pain.  Gastrointestinal:  Negative for abdominal pain.    Physical Exam Updated Vital Signs BP (!) 136/93 (BP Location:  Right Arm)   Pulse 86   Temp 98.7 F (37.1 C) (Oral)   Resp 18   Ht 5\' 11"  (1.803 m)   Wt (!) 154.2 kg   SpO2 99%   BMI 47.42 kg/m  Physical Exam Vitals and nursing note reviewed.  Constitutional:      Appearance: He is well-developed. He is obese.  HENT:     Head: Normocephalic and atraumatic.  Cardiovascular:     Rate and Rhythm: Normal rate and regular rhythm.     Heart sounds: Normal heart sounds.  Pulmonary:     Effort: Pulmonary effort is normal.     Breath sounds: Normal breath sounds.  Chest:     Chest wall: Tenderness present.    Abdominal:     Palpations: Abdomen is soft.     Tenderness: There is no abdominal tenderness.  Skin:    General: Skin is warm and dry.  Neurological:     Mental Status: He is alert.     ED Results / Procedures / Treatments   Labs (all labs ordered are listed, but only abnormal results are displayed) Labs Reviewed  CBC WITH DIFFERENTIAL/PLATELET - Abnormal; Notable for the following components:      Result Value   RBC 5.83 (*)    MCV 79.6 (*)    MCH 25.2 (*)    All other components within normal limits  COMPREHENSIVE METABOLIC PANEL  TROPONIN I (HIGH SENSITIVITY)    EKG EKG Interpretation  Date/Time:  Friday Jun 03 2022 10:34:12 EDT Ventricular Rate:  86 PR Interval:  184 QRS Duration: 97 QT Interval:  363 QTC Calculation: 435 R Axis:   84 Text Interpretation: Sinus rhythm ST elev, probable normal early repol pattern no significant change since 2019 Confirmed by Pricilla Loveless [phone]) on 06/03/2022 10:44:01 AM  Radiology DG Chest 2 View  Result Date: 06/03/2022 CLINICAL DATA:  Chest pain after fall last week. EXAM: CHEST - 2 VIEW COMPARISON:  June 28, 2017. FINDINGS: The heart size and mediastinal contours are within normal limits. Both lungs are clear. The visualized skeletal structures are unremarkable. IMPRESSION: No active cardiopulmonary disease. Electronically Signed   By: Lupita Raider M.D.   On: 06/03/2022  11:44   DG Sternum  Result Date: 06/03/2022 CLINICAL DATA:  Chest/sternal pain.  Fall 1 week ago. EXAM: STERNUM - 2+ VIEW COMPARISON:  None Available. FINDINGS: There is no evidence of fracture or other focal bone lesions. Anterior mediastinum is clear. Heart size is normal. The lungs are clear. IMPRESSION: Negative. Electronically Signed   By: Marin Roberts M.D.   On: 06/03/2022 11:43    Procedures Procedures    Medications Ordered in ED Medications  lidocaine (LIDODERM) 5 % 1 patch (1 patch Transdermal Patch Applied 06/03/22 1107)  ketorolac (TORADOL) 15 MG/ML injection 15 mg (15 mg Intravenous Given 06/03/22 1104)    ED Course/ Medical Decision Making/ A&P  Medical Decision Making Amount and/or Complexity of Data Reviewed Labs: ordered.    Details: Troponin negative.  WBC normal. Radiology: ordered and independent interpretation performed.    Details: No rib or sternal fracture or pneumothorax. ECG/medicine tests: ordered and independent interpretation performed.    Details: No ischemia  Risk Prescription drug management.   Appears to have some chest wall discomfort.  Could have had a minor sternal injury though no fracture seen and his last injury was several days ago.  Unsure if these are 100% related but either way I think supportive care with lighted Derm and NSAIDs is reasonable.  He is requesting a muscle relaxer prescription which will be given.  Otherwise, low suspicion for ACS, PE, dissection or significant trauma.  Will discharge home with return precautions.        Final Clinical Impression(s) / ED Diagnoses Final diagnoses:  Chest wall pain    Rx / DC Orders ED Discharge Orders          Ordered    methocarbamol (ROBAXIN) 500 MG tablet  Every 8 hours PRN        06/03/22 1248    lidocaine (LIDODERM) 5 %  Every 24 hours        06/03/22 1248              Pricilla Loveless, MD 06/03/22 1417

## 2022-06-23 ENCOUNTER — Other Ambulatory Visit: Payer: Self-pay | Admitting: Sports Medicine

## 2022-06-23 DIAGNOSIS — R0789 Other chest pain: Secondary | ICD-10-CM

## 2022-06-28 ENCOUNTER — Ambulatory Visit
Admission: RE | Admit: 2022-06-28 | Discharge: 2022-06-28 | Disposition: A | Discharge status: Home / Self Care | Payer: 59 | Source: Ambulatory Visit | Attending: Sports Medicine | Admitting: Sports Medicine

## 2022-06-28 DIAGNOSIS — R0789 Other chest pain: Secondary | ICD-10-CM

## 2022-08-13 ENCOUNTER — Ambulatory Visit
Admission: RE | Admit: 2022-08-13 | Discharge: 2022-08-13 | Disposition: A | Discharge status: Home / Self Care | Payer: 59 | Source: Ambulatory Visit

## 2022-08-13 VITALS — BP 141/98 | HR 99 | Temp 98.6°F | Resp 12

## 2022-08-13 DIAGNOSIS — B356 Tinea cruris: Secondary | ICD-10-CM

## 2022-08-13 DIAGNOSIS — L080 Pyoderma: Secondary | ICD-10-CM | POA: Diagnosis not present

## 2022-08-13 DIAGNOSIS — L739 Follicular disorder, unspecified: Secondary | ICD-10-CM

## 2022-08-13 MED ORDER — CEPHALEXIN 500 MG PO CAPS: 500.0000 mg | ORAL_CAPSULE | Freq: Four times a day (QID) | ORAL | 0 refills | Status: AC

## 2022-08-13 MED ORDER — CLOTRIMAZOLE 1 % EX CREA: 1.0000 | TOPICAL_CREAM | Freq: Two times a day (BID) | CUTANEOUS | 0 refills | Status: AC

## 2022-08-13 NOTE — Discharge Instructions (Signed)
Start cephalexin to cover for folliculitis.  Keep your skin clean and cool is much as possible.  Apply clotrimazole to your scrotum twice daily for 2 weeks.  If your symptoms or not improving quickly with this medication regimen or if anything worsens and you have spread of rash, fever, nausea, vomiting you should be seen immediately.

## 2022-08-13 NOTE — ED Triage Notes (Signed)
Pt c/o rash, started on the abdomen, Wednesday morning. Midday Wednesday at work pt noticed burning on the outside of his scrotum with redness and skin peeling off. States he had to use a cool wet paper towel to calm the burning sensation. Then as th week went on he noticed the rash spread down to his legs main the left left but rash is present on the right as well. Pt states he was coming home from [facility] Tuesday. This occurred on Wednesday.

## 2022-08-13 NOTE — ED Provider Notes (Signed)
[facility]    CSN: [id number] Arrival date & time: 08/13/22  6213      History   Chief Complaint Chief Complaint  Patient presents with   Rash    Entered by patient    HPI Maximo Azzan Navas is a 32 y.o. male.   Patient presents today with a several day history of rash.  He reports rash initially began on his left abdomen and then spread to involve his thigh and bilateral lower legs.  He reports that these were pustular but he has scratched them and now they have just become red.  He denies any significant pain.  Reports that symptoms began after he returned from a vacation in [place] where he was exposed to extreme heat while at theme parks.  A few days ago he developed a burning sensation in his scrotum.  He went to the bathroom and placed a cool rag on his scrotum and then noticed that he had some rash and skin scaling in this area.  He has applied antifungal cream which has provided improvement of the symptoms but he continues to have the rash.  He denies any known exposure to plants, insects, animals.  Denies any changes to personal hygiene products including soaps or detergents.  He does have a history of eczema but states current symptoms not similar to previous episodes of this condition.  Denies additional history of dermatological condition.  Denies any fever, nausea, vomiting.  Denies any recent antibiotic use.    Past Medical History:  Diagnosis Date   Arthritis    Asthma    Condylomata acuminata    Crohn disease (HCC)    Depression    Frequent headaches    History of chicken pox    Hypertension     Patient Active Problem List   Diagnosis Date Noted   Severe obesity (BMI >= 40) (HCC) 02/06/2014   Asthma, mild intermittent 02/06/2014   Arthritis of knee 02/06/2014   Elevated blood pressure 02/06/2014   Frequent headaches 02/06/2014   Depression 12/16/2010   IBS (irritable bowel syndrome) 12/16/2010    Past Surgical History:  Procedure  Laterality Date   MENISCUS REPAIR  01/17/2010   NASAL TURBINATE REDUCTION     TONSILLECTOMY         Home Medications    Prior to Admission medications   Medication Sig Start Date End Date Taking? Authorizing Provider  cephALEXin (KEFLEX) 500 MG capsule Take 1 capsule (500 mg total) by mouth 4 (four) times daily. 08/13/22  Yes Deston Bilyeu, Noberto Retort, PA-C  clotrimazole (LOTRIMIN) 1 % cream Apply 1 Application topically 2 (two) times daily. 08/13/22  Yes Ashdon Gillson K, PA-C  albuterol (VENTOLIN HFA) 108 (90 Base) MCG/ACT inhaler Inhale 2 puffs into the lungs as needed for wheezing or shortness of breath.    [provider]  amLODipine (NORVASC) 10 MG tablet Take by mouth. 07/12/17   [provider]  amphetamine-dextroamphetamine (ADDERALL XR) 20 MG 24 hr capsule Take 20 mg by mouth every morning. 03/14/19   [provider]  amphetamine-dextroamphetamine (ADDERALL) 20 MG tablet Take 20 mg by mouth daily in the afternoon. 03/14/19   [provider]  chlorthalidone (HYGROTON) 25 MG tablet Take 25 mg by mouth daily. 06/06/20   [provider]  EPINEPHrine 0.3 mg/0.3 mL IJ SOAJ injection Inject 0.3 mg into the muscle as needed for anaphylaxis. 06/08/18   [provider]  loratadine (CLARITIN) 10 MG tablet Take 10 mg by mouth  at bedtime. 09/08/21   [provider]  nebivolol (BYSTOLIC) 10 MG tablet Take 10 mg by mouth daily.    [provider]  SUMAtriptan (IMITREX) 50 MG tablet Take 1 tablet by mouth for severe headache, can repeat in 2 hours as needed Patient not taking: Reported on 06/03/2022 12/19/14   Lorre Munroe, NP  Ubrogepant (UBRELVY) 100 MG TABS Take 100 mg by mouth daily as needed (migraines).    [provider]  Vitamin D, Ergocalciferol, (DRISDOL) 1.25 MG (50000 UNIT) CAPS capsule Take 50,000 Units by mouth once a week. Saturdays 03/02/22   [provider]    Family History Family History  Problem Relation  Age of Onset   Diabetes Mother    Arthritis Mother    Hyperlipidemia Mother    Hypertension Mother    Mental illness Mother    Alcohol abuse Father    Drug abuse Father    Cancer Father        colon   Hyperlipidemia Father    Arthritis Maternal Grandmother    Hyperlipidemia Maternal Grandmother    Arthritis Maternal Grandfather    Hyperlipidemia Maternal Grandfather    Arthritis Paternal Grandmother    Hyperlipidemia Paternal Grandmother    Hypertension Paternal Grandmother    Arthritis Paternal Grandfather    Cancer Paternal Grandfather        colon and prostate   Hyperlipidemia Paternal Grandfather    Diabetes Paternal Grandfather     Social History Social History   Tobacco Use   Smoking status: Never   Smokeless tobacco: Never  Vaping Use   Vaping status: Never Used  Substance Use Topics   Alcohol use: Yes    Alcohol/week: 0.0 standard drinks of alcohol    Comment: social   Drug use: No     Allergies   Lisinopril and Mushroom extract complex   Review of Systems Review of Systems  Constitutional:  Positive for activity change. Negative for appetite change, fatigue and fever.  Gastrointestinal:  Negative for abdominal pain, diarrhea, nausea and vomiting.  Musculoskeletal:  Negative for arthralgias and myalgias.  Skin:  Positive for rash.     Physical Exam Triage Vital Signs ED Triage Vitals  Encounter Vitals Group     BP 08/13/22 0942 (!) 141/98     Systolic BP Percentile --      Diastolic BP Percentile --      Pulse Rate 08/13/22 0942 99     Resp 08/13/22 0942 12     Temp 08/13/22 0942 98.6 F (37 C)     Temp Source 08/13/22 0942 Oral     SpO2 08/13/22 0942 95 %     Weight --      Height --      Head Circumference --      Peak Flow --      Pain Score 08/13/22 0948 4     Pain Loc --      Pain Education --      Exclude from Growth Chart --    No data found.  Updated Vital Signs BP (!) 141/98 (BP Location: Right Arm)   Pulse 99   Temp  98.6 F (37 C) (Oral)   Resp 12   SpO2 95%   Visual Acuity Right Eye Distance:   Left Eye Distance:   Bilateral Distance:    Right Eye Near:   Left Eye Near:    Bilateral Near:     Physical Exam Vitals reviewed.  Constitutional:      General: He is awake.     Appearance: Normal appearance. He is well-developed. He is not ill-appearing.     Comments: Very pleasant male appears stated age in no acute distress sitting comfortably in exam room  HENT:     Head: Normocephalic and atraumatic.     Mouth/Throat:     Pharynx: Uvula midline. No oropharyngeal exudate or posterior oropharyngeal erythema.  Cardiovascular:     Rate and Rhythm: Normal rate and regular rhythm.     Heart sounds: Normal heart sounds, S1 normal and S2 normal. No murmur heard. Pulmonary:     Effort: Pulmonary effort is normal.     Breath sounds: Normal breath sounds. No stridor. No wheezing, rhonchi or rales.     Comments: Clear to auscultation bilaterally Skin:    Findings: Rash present. Rash is papular and pustular.          Comments: Multiple papular and pustular lesions noted trunk, lateral left foot, bilateral anterior ankles.  No streaking or evidence of lymphangitis.  No active bleeding or drainage noted.  Mild scaling with associated erythema noted scrotum.  No significant erythema or tenderness palpation.  No warmth to touch.  Willette Brace, RN present as chaperone during exam.  Neurological:     Mental Status: He is alert.  Psychiatric:        Behavior: Behavior is cooperative.      UC Treatments / Results  Labs (all labs ordered are listed, but only abnormal results are displayed) Labs Reviewed - No data to display  EKG   Radiology No results found.  Procedures Procedures (including critical care time)  Medications Ordered in UC Medications - No data to display  Initial Impression / Assessment and Plan / UC Course  I have reviewed the triage vital signs and the nursing  notes.  Pertinent labs & imaging results that were available during my care of the patient were reviewed by me and considered in my medical decision making (see chart for details).     Patient is well-appearing, afebrile, nontoxic, nontachycardic.  Will cover for folliculitis which was likely triggered by increased perspiration during extreme heat exposure while on vacation in [place].  Patient was started on cephalexin 4 times daily for 5 days.  Recommended to keep area clean with soap and water.  Discussed that if he has additional lesions or if anything changes he should return for reevaluation.  I am also concerned for tinea cruris contributing to scrotal symptoms.  Will treat with clotrimazole twice daily for minimum of 2 weeks.  We discussed the importance of keeping this area clean and dry is much as possible.  Discussed that if at any point he has worsening symptoms including spread of rash, fever, nausea, vomiting he should be seen immediately.  Strict return precautions given.  All questions answered to patient satisfaction.  Final Clinical Impressions(s) / UC Diagnoses   Final diagnoses:  Folliculitis  Pustular rash  Tinea cruris     Discharge Instructions      Start cephalexin to cover for folliculitis.  Keep your skin clean and cool is much as possible.  Apply clotrimazole to your scrotum twice daily for 2 weeks.  If your symptoms or not improving quickly with this medication regimen or if anything worsens and you have spread of rash, fever, nausea, vomiting you should be seen immediately.     ED Prescriptions     Medication Sig Dispense Auth. Provider   cephALEXin [facility])  500 MG capsule Take 1 capsule (500 mg total) by mouth 4 (four) times daily. 20 capsule Ilanna Deihl K, PA-C   clotrimazole (LOTRIMIN) 1 % cream Apply 1 Application topically 2 (two) times daily. 30 g Tonimarie Gritz, Noberto Retort, PA-C      PDMP not reviewed this encounter.   Jeani Hawking, PA-C 08/13/22  1020

## 2022-09-01 ENCOUNTER — Ambulatory Visit
Admission: EM | Admit: 2022-09-01 | Discharge: 2022-09-01 | Disposition: A | Discharge status: Home / Self Care | Payer: 59 | Attending: Internal Medicine | Admitting: Internal Medicine

## 2022-09-01 ENCOUNTER — Other Ambulatory Visit (HOSPITAL_COMMUNITY): Payer: Self-pay

## 2022-09-01 DIAGNOSIS — R21 Rash and other nonspecific skin eruption: Secondary | ICD-10-CM

## 2022-09-01 MED ORDER — PREDNISONE 10 MG (21) PO TBPK
ORAL_TABLET | Freq: Every day | ORAL | 0 refills | Status: DC
Start: 2022-09-01 — End: 2022-09-01

## 2022-09-01 MED ORDER — TRIAMCINOLONE ACETONIDE 0.1 % EX CREA
1.0000 | TOPICAL_CREAM | Freq: Two times a day (BID) | CUTANEOUS | 0 refills | Status: DC
Start: 2022-09-01 — End: 2022-09-01

## 2022-09-01 MED ORDER — TRIAMCINOLONE ACETONIDE 0.1 % EX CREA
1.0000 | TOPICAL_CREAM | Freq: Two times a day (BID) | CUTANEOUS | 0 refills | Status: AC
Start: 2022-09-01 — End: ?
  Filled 2022-09-01: qty 30, 15d supply, fill #0

## 2022-09-01 MED ORDER — PREDNISONE 10 MG PO TABS
ORAL_TABLET | Freq: Every day | ORAL | 0 refills | Status: AC
  Filled 2022-09-01: qty 21, 6d supply, fill #0

## 2022-09-01 NOTE — ED Triage Notes (Signed)
Pt presents with c/o a persistent bilateral leg rash that is now blistering. States he was seen 4 wks ago for the same rash.   Reports swelling and redness tot he area.

## 2022-09-01 NOTE — Discharge Instructions (Signed)
Start prednisone as prescribed.  You may also use a topical triamcinolone steroid cream to the area twice daily as needed.  Please follow-up with your PCP if symptoms or not improving.  Please go to the ER for any worsening symptoms.  I hope you feel better soon!

## 2022-09-01 NOTE — ED Provider Notes (Signed)
[facility]    CSN: [id number] Arrival date & time: 09/01/22  1125      History   Chief Complaint Chief Complaint  Patient presents with   Rash    HPI Zachary Sanford is a 32 y.o. male resents for evaluation of a rash.  Patient was seen in urgent care on 7/27 for a pustular/pruritic rash on his legs, thighs, abdomen as well as a fungal infection of his groin.  He was treated with clotrimazole cream and cephalexin.  He reports a fungal infection has resolved and he completed the cephalexin with no improvement in the rash.  He continues to report painful/pruritic red blisters on his lower legs.  Denies any drainage, swelling, warmth, fevers, chills.  He does have a history of eczema but states this is not typical of his eczema presentation.  He has had his apartment cleaned and checked for bedbugs.  No contact with a similar rash.  He has not used any OTC medications for symptoms.  No other concerns at this time.  Rash   Past Medical History:  Diagnosis Date   Arthritis    Asthma    Condylomata acuminata    Crohn disease (HCC)    Depression    Frequent headaches    History of chicken pox    Hypertension     Patient Active Problem List   Diagnosis Date Noted   Severe obesity (BMI >= 40) (HCC) 02/06/2014   Asthma, mild intermittent 02/06/2014   Arthritis of knee 02/06/2014   Elevated blood pressure 02/06/2014   Frequent headaches 02/06/2014   Depression 12/16/2010   IBS (irritable bowel syndrome) 12/16/2010    Past Surgical History:  Procedure Laterality Date   MENISCUS REPAIR  01/17/2010   NASAL TURBINATE REDUCTION     TONSILLECTOMY         Home Medications    Prior to Admission medications   Medication Sig Start Date End Date Taking? Authorizing Provider  albuterol (VENTOLIN HFA) 108 (90 Base) MCG/ACT inhaler Inhale 2 puffs into the lungs as needed for wheezing or shortness of breath.    [provider]  amLODipine (NORVASC) 10 MG  tablet Take by mouth. 07/12/17   [provider]  amphetamine-dextroamphetamine (ADDERALL XR) 20 MG 24 hr capsule Take 20 mg by mouth every morning. 03/14/19   [provider]  amphetamine-dextroamphetamine (ADDERALL) 20 MG tablet Take 20 mg by mouth daily in the afternoon. 03/14/19   [provider]  cephALEXin (KEFLEX) 500 MG capsule Take 1 capsule (500 mg total) by mouth 4 (four) times daily. 08/13/22   Raspet, Noberto Retort, PA-C  chlorthalidone (HYGROTON) 25 MG tablet Take 25 mg by mouth daily. 06/06/20   [provider]  clotrimazole (LOTRIMIN) 1 % cream Apply 1 Application topically 2 (two) times daily. 08/13/22   Raspet, Noberto Retort, PA-C  EPINEPHrine 0.3 mg/0.3 mL IJ SOAJ injection Inject 0.3 mg into the muscle as needed for anaphylaxis. 06/08/18   [provider]  loratadine (CLARITIN) 10 MG tablet Take 10 mg by mouth at bedtime. 09/08/21   [provider]  nebivolol (BYSTOLIC) 10 MG tablet Take 10 mg by mouth daily.    [provider]  predniSONE (STERAPRED UNI-PAK 21 TAB) 10 MG (21) TBPK tablet Take by mouth daily. Take 6 tabs by mouth daily  for 1 day, then 5 tabs for 1 day, then 4 tabs for 1 day, then 3 tabs for 1 day, 2 tabs for 1 day, then  1 tab by mouth daily for 1 days 09/01/22   Radford Pax, NP  SUMAtriptan (IMITREX) 50 MG tablet Take 1 tablet by mouth for severe headache, can repeat in 2 hours as needed Patient not taking: Reported on 06/03/2022 12/19/14   Lorre Munroe, NP  triamcinolone cream (KENALOG) 0.1 % Apply 1 Application topically 2 (two) times daily. 09/01/22   Radford Pax, NP  Ubrogepant (UBRELVY) 100 MG TABS Take 100 mg by mouth daily as needed (migraines).    [provider]  Vitamin D, Ergocalciferol, (DRISDOL) 1.25 MG (50000 UNIT) CAPS capsule Take 50,000 Units by mouth once a week. Saturdays 03/02/22   [provider]    Family History Family History  Problem Relation Age of Onset   Diabetes Mother     Arthritis Mother    Hyperlipidemia Mother    Hypertension Mother    Mental illness Mother    Alcohol abuse Father    Drug abuse Father    Cancer Father        colon   Hyperlipidemia Father    Arthritis Maternal Grandmother    Hyperlipidemia Maternal Grandmother    Arthritis Maternal Grandfather    Hyperlipidemia Maternal Grandfather    Arthritis Paternal Grandmother    Hyperlipidemia Paternal Grandmother    Hypertension Paternal Grandmother    Arthritis Paternal Grandfather    Cancer Paternal Grandfather        colon and prostate   Hyperlipidemia Paternal Grandfather    Diabetes Paternal Grandfather     Social History Social History   Tobacco Use   Smoking status: Never   Smokeless tobacco: Never  Vaping Use   Vaping status: Never Used  Substance Use Topics   Alcohol use: Yes    Alcohol/week: 0.0 standard drinks of alcohol    Comment: social   Drug use: No     Allergies   Lisinopril and Mushroom extract complex   Review of Systems Review of Systems  Skin:  Positive for rash.     Physical Exam Triage Vital Signs ED Triage Vitals [09/01/22 1131]  Encounter Vitals Group     BP (!) 155/109     Systolic BP Percentile      Diastolic BP Percentile      Pulse Rate 87     Resp 15     Temp 98.1 F (36.7 C)     Temp Source Oral     SpO2 95 %     Weight      Height      Head Circumference      Peak Flow      Pain Score 0     Pain Loc      Pain Education      Exclude from Growth Chart    No data found.  Updated Vital Signs BP (!) 155/109 Comment (BP Location): forearm  Pulse 87   Temp 98.1 F (36.7 C) (Oral)   Resp 15   SpO2 95%   Visual Acuity Right Eye Distance:   Left Eye Distance:   Bilateral Distance:    Right Eye Near:   Left Eye Near:    Bilateral Near:     Physical Exam Vitals and nursing note reviewed.  Constitutional:      General: He is not in acute distress.    Appearance: Normal appearance. He is not ill-appearing,  toxic-appearing or diaphoretic.  HENT:     Head: Normocephalic and atraumatic.  Eyes:  Pupils: Pupils are equal, round, and reactive to light.  Cardiovascular:     Rate and Rhythm: Normal rate.  Pulmonary:     Effort: Pulmonary effort is normal.  Skin:    General: Skin is warm and dry.     Comments: Scattered mildly erythematous macular papular rash on bilateral lower legs.  No visible blisters or pustules.  No swelling, drainage, scaling, warmth.  Neurological:     General: No focal deficit present.     Mental Status: He is alert and oriented to person, place, and time.  Psychiatric:        Mood and Affect: Mood normal.        Behavior: Behavior normal.      UC Treatments / Results  Labs (all labs ordered are listed, but only abnormal results are displayed) Labs Reviewed - No data to display  EKG   Radiology No results found.  Procedures Procedures (including critical care time)  Medications Ordered in UC Medications - No data to display  Initial Impression / Assessment and Plan / UC Course  I have reviewed the triage vital signs and the nursing notes.  Pertinent labs & imaging results that were available during my care of the patient were reviewed by me and considered in my medical decision making (see chart for details).     Reviewed exam and symptoms with patient.  No red flags.  Unclear cause of rash.  Not responsive to antibiotics.  Will do trial of topical and oral prednisone.  Advised patient to follow-up with PCP or dermatology if symptoms do not improve.  ER precautions reviewed and patient verbalized understanding. Final Clinical Impressions(s) / UC Diagnoses   Final diagnoses:  Rash and nonspecific skin eruption     Discharge Instructions      Start prednisone as prescribed.  You may also use a topical triamcinolone steroid cream to the area twice daily as needed.  Please follow-up with your PCP if symptoms or not improving.  Please go to the ER  for any worsening symptoms.  I hope you feel better soon!    ED Prescriptions     Medication Sig Dispense Auth. Provider   triamcinolone cream (KENALOG) 0.1 %  (Status: Discontinued) Apply 1 Application topically 2 (two) times daily. 30 g Radford Pax, NP   predniSONE (STERAPRED UNI-PAK 21 TAB) 10 MG (21) TBPK tablet  (Status: Discontinued) Take by mouth daily. Take 6 tabs by mouth daily  for 1 day, then 5 tabs for 1 day, then 4 tabs for 1 day, then 3 tabs for 1 day, 2 tabs for 1 day, then 1 tab by mouth daily for 1 days 21 tablet Radford Pax, NP   triamcinolone cream (KENALOG) 0.1 % Apply 1 Application topically 2 (two) times daily. 30 g Radford Pax, NP   predniSONE (STERAPRED UNI-PAK 21 TAB) 10 MG (21) TBPK tablet Take by mouth daily. Take 6 tabs by mouth daily  for 1 day, then 5 tabs for 1 day, then 4 tabs for 1 day, then 3 tabs for 1 day, 2 tabs for 1 day, then 1 tab by mouth daily for 1 days 21 tablet Radford Pax, NP      PDMP not reviewed this encounter.   Radford Pax, NP 09/01/22 1205
